# Patient Record
Sex: Male | Born: 1960 | Race: White | Hispanic: No | Marital: Married | Smoking: Former smoker
Health system: Southern US, Community
[De-identification: ages and names within clinical notes are randomized; demographics above are authoritative.]

## PROBLEM LIST (undated history)

## (undated) DIAGNOSIS — Z8719 Personal history of other diseases of the digestive system: Secondary | ICD-10-CM

## (undated) DIAGNOSIS — M7541 Impingement syndrome of right shoulder: Secondary | ICD-10-CM

## (undated) DIAGNOSIS — Z972 Presence of dental prosthetic device (complete) (partial): Secondary | ICD-10-CM

## (undated) DIAGNOSIS — F419 Anxiety disorder, unspecified: Secondary | ICD-10-CM

## (undated) DIAGNOSIS — G629 Polyneuropathy, unspecified: Secondary | ICD-10-CM

## (undated) DIAGNOSIS — M24111 Other articular cartilage disorders, right shoulder: Secondary | ICD-10-CM

## (undated) DIAGNOSIS — I10 Essential (primary) hypertension: Secondary | ICD-10-CM

## (undated) DIAGNOSIS — K219 Gastro-esophageal reflux disease without esophagitis: Secondary | ICD-10-CM

## (undated) DIAGNOSIS — Z87442 Personal history of urinary calculi: Secondary | ICD-10-CM

## (undated) DIAGNOSIS — M19011 Primary osteoarthritis, right shoulder: Secondary | ICD-10-CM

## (undated) DIAGNOSIS — M720 Palmar fascial fibromatosis [Dupuytren]: Secondary | ICD-10-CM

## (undated) DIAGNOSIS — G5621 Lesion of ulnar nerve, right upper limb: Secondary | ICD-10-CM

## (undated) DIAGNOSIS — K648 Other hemorrhoids: Secondary | ICD-10-CM

## (undated) DIAGNOSIS — E119 Type 2 diabetes mellitus without complications: Secondary | ICD-10-CM

## (undated) DIAGNOSIS — S46019A Strain of muscle(s) and tendon(s) of the rotator cuff of unspecified shoulder, initial encounter: Secondary | ICD-10-CM

## (undated) DIAGNOSIS — Z85828 Personal history of other malignant neoplasm of skin: Secondary | ICD-10-CM

## (undated) DIAGNOSIS — M2560 Stiffness of unspecified joint, not elsewhere classified: Secondary | ICD-10-CM

## (undated) HISTORY — PX: COLONOSCOPY: SHX174

---

## 1996-07-18 HISTORY — PX: SHOULDER ARTHROSCOPY: SHX128

## 1997-07-18 HISTORY — PX: CERVICAL DISC SURGERY: SHX588

## 1997-11-27 ENCOUNTER — Ambulatory Visit (HOSPITAL_COMMUNITY): Admission: RE | Admit: 1997-11-27 | Discharge: 1997-11-27 | Payer: Self-pay | Admitting: Neurosurgery

## 1998-05-06 ENCOUNTER — Ambulatory Visit (HOSPITAL_COMMUNITY): Admission: RE | Admit: 1998-05-06 | Discharge: 1998-05-06 | Payer: Self-pay | Admitting: Neurosurgery

## 1998-05-06 ENCOUNTER — Encounter: Payer: Self-pay | Admitting: Neurosurgery

## 1998-05-20 ENCOUNTER — Encounter: Payer: Self-pay | Admitting: Neurosurgery

## 1998-05-20 ENCOUNTER — Ambulatory Visit (HOSPITAL_COMMUNITY): Admission: RE | Admit: 1998-05-20 | Discharge: 1998-05-20 | Payer: Self-pay | Admitting: Neurosurgery

## 1998-06-03 ENCOUNTER — Encounter: Payer: Self-pay | Admitting: Neurosurgery

## 1998-06-03 ENCOUNTER — Ambulatory Visit (HOSPITAL_COMMUNITY): Admission: RE | Admit: 1998-06-03 | Discharge: 1998-06-03 | Payer: Self-pay | Admitting: Neurosurgery

## 1998-06-18 ENCOUNTER — Encounter: Payer: Self-pay | Admitting: Neurosurgery

## 1998-06-19 ENCOUNTER — Ambulatory Visit (HOSPITAL_COMMUNITY): Admission: RE | Admit: 1998-06-19 | Discharge: 1998-06-19 | Payer: Self-pay | Admitting: Neurosurgery

## 1998-06-22 ENCOUNTER — Encounter: Payer: Self-pay | Admitting: Neurosurgery

## 1998-06-23 ENCOUNTER — Encounter: Payer: Self-pay | Admitting: Neurosurgery

## 1998-06-23 ENCOUNTER — Inpatient Hospital Stay (HOSPITAL_COMMUNITY): Admission: RE | Admit: 1998-06-23 | Discharge: 1998-06-24 | Payer: Self-pay | Admitting: Neurosurgery

## 2001-06-07 ENCOUNTER — Ambulatory Visit (HOSPITAL_COMMUNITY): Admission: RE | Admit: 2001-06-07 | Discharge: 2001-06-07 | Payer: Self-pay | Admitting: General Surgery

## 2002-11-06 ENCOUNTER — Inpatient Hospital Stay (HOSPITAL_COMMUNITY): Admission: RE | Admit: 2002-11-06 | Discharge: 2002-11-08 | Payer: Self-pay | Admitting: Neurosurgery

## 2002-11-06 ENCOUNTER — Encounter: Payer: Self-pay | Admitting: Neurosurgery

## 2002-11-06 HISTORY — PX: NECK HARDWARE REMOVAL: SUR1129

## 2002-11-06 HISTORY — PX: CERVICAL FUSION: SHX112

## 2008-01-29 ENCOUNTER — Inpatient Hospital Stay (HOSPITAL_COMMUNITY): Admission: RE | Admit: 2008-01-29 | Discharge: 2008-01-31 | Payer: Self-pay | Admitting: Neurosurgery

## 2008-01-29 HISTORY — PX: NECK HARDWARE REMOVAL: SUR1129

## 2008-07-18 HISTORY — PX: WRIST SURGERY: SHX841

## 2008-07-18 HISTORY — PX: CARPAL TUNNEL RELEASE: SHX101

## 2010-11-30 NOTE — Op Note (Signed)
NAMEALBIE, ARIZPE               ACCOUNT NO.:  [id number]   MEDICAL RECORD NO.:  [id number]          PATIENT TYPE:  OIB   LOCATION:  3009                         FACILITY:  [facility]   PHYSICIAN:  Hilda Lias, M.D.   DATE OF BIRTH:  1961/05/25   DATE OF PROCEDURE:  01/29/2008  DATE OF DISCHARGE:                               OPERATIVE REPORT   PREOPERATIVE DIAGNOSES:  1. Status post C4-C5 fusion with loose screws inferior right side.  2. Odynophagia.   POSTOPERATIVE DIAGNOSES:  1. Status post C4-C5 fusion with loose screws inferior right side.  2. Odynophagia.   PROCEDURE:  1. Removal of the plate and the four screws.  2. C-arm.   SURGEON:  Hilda Lias, MD   ASSISTANT:  Payton Doughty, MD from Neurosurgery and [facility]. Lazarus Salines, MD,  from ENT.   CLINICAL HISTORY:  Mr. Yuan is a gentleman who 5 years ago underwent  fusion.  I have not seen him in a long time and the last week he came  complaining of a hiccup and immediately developed difficult to swallow.  X-rays showed that there was loose screws inferior right side.  I  compared with the previous last x-ray 4 years ago and at that time he  was absolutely normal.  Because of that, surgery was advised.  He and  his wife knew about the risk with the surgery including the possibility  of perforation of esophagus.   PROCEDURE:  The patient was taken to the OR and after intubation, the  left side of the neck was cleaned with DuraPrep.  Transverse incision  was made from the previous one and carried through subcutaneous space,  platysma down to the cervical area.  We found quite a bit of adhesion  and lysis was accomplished.  Then after 10 minutes of lysis, we were  able to see the plate.  The plate plus the three screws were removed  without any problem whatsoever.  The screws which was loose already the  area was filled up with fibrotic tissue.  Then the difficult part of the  procedure was finding the lost screws.  We knew  it was in the right side  and with extension/flexion it moved so we knew it was a soft tissue.  We  started our dissection and we found quite a bit of adhesions.  Several C-  arm x-ray using AP and lateral view was done and although we were able  to pin point the area where the screw was but the time we changed  position pulling the retractor, the screw moved.  At the end, we were  able to localize using [employer] retractor and localized the screw right  at the edges between the spine and esophagus.  Removal was done easily.  Nevertheless, I called Dr. Lazarus Salines from the ENT Service.  He inspected  the area and there was no evidence of any bone in the esophagus.  Nevertheless, we went ahead and introduced an NG tube.  We brought the  NG tube  right below the area where the screw was with squeezing that  side  methylene blue through the NG tube.  There was no evidence of any  leakage whatsoever through the esophagus.  From then on, the area was  irrigated.  Hemostasis was accomplished and the wound was closed with  Vicryl and Steri-Strips.           ______________________________  Hilda Lias, M.D.     EB/MEDQ  D:  01/29/2008  T:  01/30/2008  Job:  [id number]

## 2010-12-03 NOTE — H&P (Signed)
NAMEMAXAMILLIAN, Jon Wong                         ACCOUNT NO.:  [id number]   MEDICAL RECORD NO.:  [id number]                   PATIENT TYPE:  INP   LOCATION:  3172                                 FACILITY:  [facility]   PHYSICIAN:  Jon Wong, M.D.                DATE OF BIRTH:  Nov 27, 1960   DATE OF ADMISSION:  11/06/2002  DATE OF DISCHARGE:                                HISTORY & PHYSICAL   CLINICAL HISTORY:  Mr. Jon Wong is a gentleman who back in 1999 underwent  anterior C5-C6 diskectomy for cervical herniated disc.  The patient did  well.  I have not seen him lately, but he came to my office with his wife  telling me that since January of this year, he has had some electricity  sensation all over his body every time he extends his neck followed with  bilateral weakness.  I saw him in the office, and after my clinical finding,  I sent him to have immediately an emergency MRI which showed that he has a  huge herniated disc at the L4-L5 with compromise of the spinal cord.  The  patient was sent home to be out of work, and he was advised to think about  surgery.  He called yesterday ready for surgery.  The patient denies any  problem with bladder or bowel.   PAST MEDICAL HISTORY:  Surgery on the shoulder and previous a discectomy by  me.   ALLERGIES:  Vicodin.   SOCIAL HISTORY:  The patient smokes one-half pack a day.  He does not drink.   FAMILY HISTORY:  Unremarkable.   REVIEW OF SYSTEMS:  Positive for frequent ear and nasal congestion, high  cholesterol, kidney stones, arthritis.  He has a history of depression.  He  has a skin cancer diagnosed back is 2002.   PHYSICAL EXAMINATION:  GENERAL:  The patient came to my office with his  wife.  VITAL SIGNS:  Normal.  NECK:  He has a previous scar.  Flexion and extension is quite limited  secondary to pain.  When he went more than 5 degrees in hyperextension, he  was complaining of some electricity along the spine.  LUNGS:   Clear.  HEART:  Sounds normal.  ABDOMEN:  Normal.  EXTREMITIES:  Normal pulses.  No metatarsal __________.  NEURO:  Cranial nerves normal.  Strength:  I can grade easily both deltoids.  He has normal biceps and normal triceps.  In the lower extremity, he has  weakness of both legs, especially with repetitive movement.  Reflexes 1+ at  the level of the biceps, 3+ in the lower extremity.  Clonus bilaterally, and  bilateral Babinski.  Coordination:  The patient cannot walk in a straight  line.   STUDIES:  The MRI showed that he has a severe case of herniated disc at the  L4-L5 with compromise of the spinal cord after reporting that we  can see  some changes of gliosis within the spinal cord itself.  Mild central disc of  the low C6-7.   RECOMMENDATIONS:  The patient is being admitted for anterior cervical  discectomy at the level of L4-L5.  The procedure will be to remove the  previous plate, decompress the spinal cord during C4-C5 discectomy, followed  by  plate and a graft.  He knows all the risks which include but are not limited  to the benefits of the surgery, worsening of the weakness, infection or  damage to the vocal cords, damage to the esophagus because of second surgery  in the same level.  He declined another opinion.                                               Jon Wong, M.D.    EB/MEDQ  D:  11/06/2002  T:  11/06/2002  Job:  [id number]

## 2010-12-03 NOTE — Op Note (Signed)
NAMETRUST, LEH                         ACCOUNT NO.:  [id number]   MEDICAL RECORD NO.:  [id number]                   PATIENT TYPE:  INP   LOCATION:  3172                                 FACILITY:  [facility]   PHYSICIAN:  Hilda Lias, M.D.                DATE OF BIRTH:  1960-11-18   DATE OF PROCEDURE:  11/06/2002  DATE OF DISCHARGE:                                 OPERATIVE REPORT   PREOPERATIVE DIAGNOSIS:  Cervical myelopathy secondary to herniated disk at  the level 4-5 with displacement of spinal cord.  Status post C5-6 diskectomy  and fusion.   POSTOPERATIVE DIAGNOSIS:  Cervical myelopathy secondary to herniated disk at  the level 4-5 with displacement of spinal cord.  Status post C5-6 diskectomy  and fusion.   OPERATION PERFORMED:  Removal of C5 to 6 plate.  Total gross diskectomy at  C4-C5.  Decompression of the spinal cord.  Bilateral foraminotomy.  Interbody fusion with allograft, iliac crest, insertion of new plate from C4  to C5.  Microscope.   SURGEON:  Hilda Lias, M.D.   ASSISTANT:  Hewitt Shorts, M.D.   ANESTHESIA:  General.   INDICATIONS FOR PROCEDURE:  The patient is a gentleman who was seen in my  office two days ago because of neck pain associated with weakness,  hyperreflexia, Babinski bilaterally and unable to walk in a straight line.  MRI which was done as emergency, showed that he has a huge herniated disk at  the level of C5-C6 with displacement of the spinal cord and changes in the  spinal cord itself.  Surgery was advised.  The risks were explained in the  history and physical.   DESCRIPTION OF PROCEDURE:  The patient was taken to the operating room and  after intubation which done in neutral position, the neck was prepped with  Betadine.  Transverse incision was followed down the fold of the skin was  made through the skin, platysma, down to the cervical spine.  By x-ray they  found the upper part of the previous plate.  We identified  the C4-C5 disk.  The anterior ligament was opened and then we brought the microscope into the  area.  Using the microcuret, total diskectomy was accomplished.  Indeed we  found some opening in posterior ligament.  Incision was made in the  posterior ligament and there were some fragments going in the midline, going  laterally compromising the spinal cord as well as the C5 nerve root.  Total  gross diskectomy was achieved.  Then the end plate was drilled and a piece  of iliac crest bone graft was introduced between 4-5.  Then we did our lysis  of adhesions and we were able to remove the old plate as well as the four  screws.  From then on we made a space for the new plate and the new plate  was between 4 and  5 using four screws.  Lateral C-spine showed good position  of the bone graft.  At the end the area was investigated.  The esophagus,  carotid and trachea were normal.  Hemostasis was done with bipolar cautery.  The wound was closed with Vicryl and Steri-Strips.                                                Hilda Lias, M.D.   EB/MEDQ  D:  11/06/2002  T:  11/06/2002  Job:  [id number]

## 2011-04-14 LAB — CBC
HCT: 42.2
Hemoglobin: 14.6
MCHC: 34.7
MCV: 90.4
RBC: 4.67
WBC: 7.8

## 2011-04-14 LAB — BASIC METABOLIC PANEL
CO2: 29
Chloride: 105
GFR calc Af Amer: >60
Potassium: 5
Sodium: 140

## 2012-09-17 ENCOUNTER — Emergency Department (HOSPITAL_COMMUNITY)
Admission: EM | Admit: 2012-09-17 | Discharge: 2012-09-18 | Disposition: A | Discharge status: Home / Self Care | Payer: PRIVATE HEALTH INSURANCE | Attending: Emergency Medicine | Admitting: Emergency Medicine

## 2012-09-17 ENCOUNTER — Encounter (HOSPITAL_COMMUNITY): Payer: Self-pay

## 2012-09-17 DIAGNOSIS — Z7982 Long term (current) use of aspirin: Secondary | ICD-10-CM | POA: Insufficient documentation

## 2012-09-17 DIAGNOSIS — IMO0002 Reserved for concepts with insufficient information to code with codable children: Secondary | ICD-10-CM | POA: Insufficient documentation

## 2012-09-17 DIAGNOSIS — Z8679 Personal history of other diseases of the circulatory system: Secondary | ICD-10-CM | POA: Insufficient documentation

## 2012-09-17 DIAGNOSIS — E119 Type 2 diabetes mellitus without complications: Secondary | ICD-10-CM | POA: Insufficient documentation

## 2012-09-17 DIAGNOSIS — F411 Generalized anxiety disorder: Secondary | ICD-10-CM | POA: Insufficient documentation

## 2012-09-17 DIAGNOSIS — I10 Essential (primary) hypertension: Secondary | ICD-10-CM

## 2012-09-17 DIAGNOSIS — Z79899 Other long term (current) drug therapy: Secondary | ICD-10-CM | POA: Insufficient documentation

## 2012-09-17 DIAGNOSIS — Z87442 Personal history of urinary calculi: Secondary | ICD-10-CM | POA: Insufficient documentation

## 2012-09-17 HISTORY — DX: Essential (primary) hypertension: I10

## 2012-09-17 HISTORY — DX: Anxiety disorder, unspecified: F41.9

## 2012-09-17 LAB — CBC WITH DIFFERENTIAL/PLATELET
Eosinophils Relative: 1 % (ref 0–5)
HCT: 44.9 % (ref 39.0–52.0)
Hemoglobin: 16.2 g/dL (ref 13.0–17.0)
Lymphocytes Relative: 20 % (ref 12–46)
Lymphs Abs: 2.8 10*3/uL (ref 0.7–4.0)
MCV: 87.7 fL (ref 78.0–100.0)
Monocytes Relative: 10 % (ref 3–12)
Platelets: 235 10*3/uL (ref 150–400)
RBC: 5.12 MIL/uL (ref 4.22–5.81)
WBC: 14.2 10*3/uL — ABNORMAL HIGH (ref 4.0–10.5)

## 2012-09-17 LAB — POCT I-STAT, CHEM 8
BUN: 18 mg/dL (ref 6–23)
Calcium, Ion: 1.09 mmol/L — ABNORMAL LOW (ref 1.12–1.23)
Chloride: 104 mEq/L (ref 96–112)
Creatinine, Ser: 0.9 mg/dL (ref 0.50–1.35)

## 2012-09-17 LAB — TROPONIN I: Troponin I: <0.3 ng/mL (ref <0.30)

## 2012-09-17 LAB — GLUCOSE, CAPILLARY: Glucose-Capillary: 123 mg/dL — ABNORMAL HIGH (ref 70–99)

## 2012-09-17 NOTE — ED Notes (Signed)
Pt's CBG=123 

## 2012-09-17 NOTE — ED Notes (Signed)
Per EMS, pt. C/o "not feeling right". C/o HA and anxiety. Upon EMS arrival pt slow to respond, no neuro deficits. Pt. Oriented x4.

## 2012-09-17 NOTE — ED Notes (Signed)
Pt. States "I took amoxicillin for a sore throat and I had an allergic reaction" x2 ago. States allergic reaction is his head "feeling funny. I'm okay in the morning but at night I feel like I'm in a daze". Pt. Reports HA and tingling in hands. Pt. Alert and oriented. States takes BP medication in the morning.

## 2012-09-17 NOTE — ED Notes (Signed)
Per pt family, pt started "feeling funny" over the weekend. States pt has been "sluggish" since he had teh allergic reaction to amoxicillin and started the prednisone.

## 2012-09-17 NOTE — ED Provider Notes (Signed)
History     CSN: [id number]  Arrival date & time 09/17/12  2038   First MD Initiated Contact with Patient 09/17/12 2149      Chief Complaint  Patient presents with  . Hypertension    (Consider location/radiation/quality/duration/timing/severity/associated sxs/prior treatment) HPI  Jon Wong is a 52 year old male with a past medical history significant for major depression, anxiety, diabetes and hypertension who presents to emergency department for evaluation of high blood pressure.  The patient states that he was at work today and feeling "lightheaded".  He had his blood pressure taken by the on-site nurse.  Patient states that his blood pressure work was approximately 209/150.  Patient was sent to the emergency department immediately.  The patient has a recent history of pain with swallowing patient states he was seen by his primary care [name] who noticed some exudate on his posterior.  He was prescribed amoxicillin.  The patient gave him amoxicillin.  At one daily patient developed severe systemic urticaria.  The patient was then started on prednisone.  Since that time he has been feeling "woozy" he headed."  He generally feels this way at the same time every day in the evening just before his next dose of prednisone.  Denies unilateral weakness, facial asymmetry, difficulty with speech, change in gait, or vertigo.  Patient states he has had increased anxiety due to his high blood pressure.  He did not take any of his tonight.  The patient's family is in the room and date the patient appears to be at baseline.   Past Medical History  Diagnosis Date  . Anxiety   . Hypertension   . Ruptured disk   . Diabetes mellitus without complication   . Afib   . Kidney stones     Past Surgical History  Procedure Laterality Date  . Back surgery      No family history on file.  History  Substance Use Topics  . Smoking status: Not on file  . Smokeless tobacco: Not on file  . Alcohol  Use: Not on file      Review of Systems Ten systems reviewed and are negative for acute change, except as noted in the HPI.   Allergies  Amoxicillin and Hydrocodone  Home Medications   Current Outpatient Rx  Name  Route  Sig  Dispense  Refill  . ALPRAZolam (XANAX) 0.5 MG tablet   Oral   Take 1-1.5 mg by mouth at bedtime. 2-3 tablets depending on how tired pt feels         . aspirin EC 81 MG tablet   Oral   Take 81 mg by mouth every evening.         . celecoxib (CELEBREX) 200 MG capsule   Oral   Take 400 mg by mouth every evening.         . Cholecalciferol (VITAMIN D-3) 5000 UNITS TABS   Oral   Take 5,000 Units by mouth daily.         . citalopram (CELEXA) 40 MG tablet   Oral   Take 40 mg by mouth every evening.         . cyanocobalamin 2000 MCG tablet   Oral   Take 2,000 mcg by mouth daily.         Marland Kitchen EPINEPHrine (EPIPEN IJ)   Injection   Inject 1 application as directed as needed. For allergic reactions         . Eszopiclone 3 MG TABS   Oral  Take 3 mg by mouth at bedtime. Take immediately before bedtime         . fenofibrate (TRICOR) 145 MG tablet   Oral   Take 145 mg by mouth every evening.         Marland Kitchen METFORMIN HCL PO   Oral   Take 1 tablet by mouth every evening.         . predniSONE (DELTASONE) 20 MG tablet   Oral   Take 20-120 mg by mouth See admin instructions. Tapered dose started 09/07/2012:  6 tabs for 2 days, 5 tabs for 2 days, 4 tabs for 2 days, 3 tabs for 2 days, 2 tabs for 2 days, 1 tab for 2 days - takes at night         . ramipril (ALTACE) 5 MG capsule   Oral   Take 5 mg by mouth daily.         Marland Kitchen SIMVASTATIN PO   Oral   Take 1 tablet by mouth every evening.           BP 156/94  Pulse 85  Temp(Src) 98.2 F (36.8 C) (Oral)  Resp 21  SpO2 96%  Physical Exam Physical Exam  Nursing note and vitals reviewed. Constitutional: He appears well-developed and well-nourished. No distress.  HENT:  Head:  Normocephalic and atraumatic.  Eyes: Conjunctivae normal are normal. No scleral icterus.  Neck: Normal range of motion. Neck supple.  Cardiovascular: Normal rate, regular rhythm and normal heart sounds.   Pulmonary/Chest: Effort normal and breath sounds normal. No respiratory distress.  Abdominal: Soft. There is no tenderness.  Musculoskeletal: He exhibits no edema.  Neurological: He is alert.  Speech is clear and goal oriented, follows commands Major Cranial nerves without deficit, no facial droop Normal strength in upper and lower extremities bilaterally including dorsiflexion and plantar flexion, strong and equal grip strength Sensation normal to light and sharp touch Moves extremities without ataxia, coordination intact Normal finger to nose and rapid alternating movements Neg romberg, no pronator drift Normal heel-shin and balance Skin: Skin is warm and dry. He is not diaphoretic.  Psychiatric: His behavior is normal.    ED Course  Procedures (including critical care time)  Labs Reviewed  GLUCOSE, CAPILLARY - Abnormal; Notable for the following:    Glucose-Capillary 123 (*)    All other components within normal limits  CBC WITH DIFFERENTIAL - Abnormal; Notable for the following:    WBC 14.2 (*)    MCHC 36.1 (*)    Neutro Abs 10.0 (*)    Monocytes Absolute 1.4 (*)    All other components within normal limits  GLUCOSE, CAPILLARY - Abnormal; Notable for the following:    Glucose-Capillary 121 (*)    All other components within normal limits  POCT I-STAT, CHEM 8 - Abnormal; Notable for the following:    Glucose, Bld 127 (*)    Calcium, Ion 1.09 (*)    All other components within normal limits  TROPONIN I   No results found.   Date: 09/18/2012  Rate:83  Rhythm: normal sinus rhythm  QRS Axis: normal  Intervals: normal  ST/T Wave abnormalities: Nonspecific T wave abnormalities  Conduction Disutrbances: none  Narrative Interpretation:   Old EKG Reviewed: No  significant changes noted     No diagnosis found.    MDM   Filed Vitals:   09/17/12 2145 09/17/12 2215 09/17/12 2245 09/17/12 2345  BP: 163/100 171/100 155/100 156/99  Pulse: 83 88 83 88  Temp:  TempSrc:      Resp: 22 21 13 26   SpO2: 95% 96% 96% 96%  patient with complaint of feeling strange and high blood pressure. .Denies stroke like symptoms.   12:33 AM CBG (last 3)   Recent Labs  09/17/12 2058 09/18/12 0023  GLUCAP 123* 121*  patient was appeared  bit diaphoretic.  He states that he is feeling much better but thought maybe his blood sugar was dropping as he has not had food since 6;30 PM/  The patient blood sugar is normal.  He has fairly unremarkable EKG and negative troponin negative.   Blood sugar is  Mildly elevated and the patient has a leukocytosis.  This is likely due to corticosteroid side effcts.   Patient noted to be hypertensive in the emergency department.  No signs of hypertensive urgency.  D/C use of the corticosteroid. Discussed with patient the need for close follow-up and management by their primary care physician.       Arthor Captain, PA-C 09/18/12 [phone]

## 2012-09-18 LAB — GLUCOSE, CAPILLARY: Glucose-Capillary: 121 mg/dL — ABNORMAL HIGH (ref 70–99)

## 2012-09-18 NOTE — ED Notes (Signed)
PA at bedside.

## 2012-09-18 NOTE — ED Provider Notes (Signed)
Jon Wong is a 52 y.o. male who felt "foggy" at work. His blood pressure was checked by a security guard and was high. He feels better now. He denies chest pain, weakness, or dizziness.  Exam alert, cooperative. Repeat vital signs, serially, indicate mild hypertension. Neurologic exam: Alert and oriented x3. Behavior is appropriate. Strength and sensation is normal in the arms, and legs bilaterally. Romberg is negative.    Assessment. Mild, hypertension. Doubt CVA, metabolic instability, or an meningiitis  Plan: See PCP one week for blood pressure check.   Medical screening examination/treatment/procedure(s) were conducted as a shared visit with non-physician practitioner(s) and myself.  I personally evaluated the patient during the encounter  Flint Melter, MD 09/18/12 [phone]

## 2012-09-18 NOTE — ED Notes (Signed)
Dr. Wentz at bedside. 

## 2012-09-18 NOTE — ED Provider Notes (Signed)
Medical screening examination/treatment/procedure(s) were performed by non-physician practitioner and as supervising physician I was immediately available for consultation/collaboration.   Flint Melter, MD 09/18/12 [phone]

## 2012-09-18 NOTE — ED Notes (Signed)
Pt. Alert and oriented x4. States "I feel a little better than I did when I came in". Pt. Verbalized understanding of medications.

## 2014-11-24 ENCOUNTER — Emergency Department (HOSPITAL_COMMUNITY): Payer: BLUE CROSS/BLUE SHIELD

## 2014-11-24 ENCOUNTER — Encounter (HOSPITAL_COMMUNITY): Payer: Self-pay | Admitting: Cardiology

## 2014-11-24 ENCOUNTER — Emergency Department (HOSPITAL_COMMUNITY)
Admission: EM | Admit: 2014-11-24 | Discharge: 2014-11-24 | Disposition: A | Discharge status: Home / Self Care | Payer: BLUE CROSS/BLUE SHIELD | Attending: Emergency Medicine | Admitting: Emergency Medicine

## 2014-11-24 DIAGNOSIS — I10 Essential (primary) hypertension: Secondary | ICD-10-CM | POA: Insufficient documentation

## 2014-11-24 DIAGNOSIS — Z880 Allergy status to penicillin: Secondary | ICD-10-CM | POA: Diagnosis not present

## 2014-11-24 DIAGNOSIS — Z7982 Long term (current) use of aspirin: Secondary | ICD-10-CM | POA: Insufficient documentation

## 2014-11-24 DIAGNOSIS — E119 Type 2 diabetes mellitus without complications: Secondary | ICD-10-CM | POA: Diagnosis not present

## 2014-11-24 DIAGNOSIS — Z79899 Other long term (current) drug therapy: Secondary | ICD-10-CM | POA: Insufficient documentation

## 2014-11-24 DIAGNOSIS — J40 Bronchitis, not specified as acute or chronic: Secondary | ICD-10-CM | POA: Diagnosis not present

## 2014-11-24 DIAGNOSIS — R4182 Altered mental status, unspecified: Secondary | ICD-10-CM | POA: Diagnosis not present

## 2014-11-24 DIAGNOSIS — Z791 Long term (current) use of non-steroidal anti-inflammatories (NSAID): Secondary | ICD-10-CM | POA: Insufficient documentation

## 2014-11-24 DIAGNOSIS — R251 Tremor, unspecified: Secondary | ICD-10-CM | POA: Insufficient documentation

## 2014-11-24 DIAGNOSIS — Z87442 Personal history of urinary calculi: Secondary | ICD-10-CM | POA: Insufficient documentation

## 2014-11-24 DIAGNOSIS — Z8739 Personal history of other diseases of the musculoskeletal system and connective tissue: Secondary | ICD-10-CM | POA: Insufficient documentation

## 2014-11-24 DIAGNOSIS — I4891 Unspecified atrial fibrillation: Secondary | ICD-10-CM | POA: Diagnosis not present

## 2014-11-24 LAB — CBC WITH DIFFERENTIAL/PLATELET
BASOS PCT: 1 % (ref 0–1)
Basophils Absolute: 0.1 10*3/uL (ref 0.0–0.1)
EOS ABS: 0.1 10*3/uL (ref 0.0–0.7)
Eosinophils Relative: 1 % (ref 0–5)
HEMATOCRIT: 45.7 % (ref 39.0–52.0)
HEMOGLOBIN: 15.5 g/dL (ref 13.0–17.0)
LYMPHS ABS: 1.8 10*3/uL (ref 0.7–4.0)
LYMPHS PCT: 15 % (ref 12–46)
MCH: 30.5 pg (ref 26.0–34.0)
MCHC: 33.9 g/dL (ref 30.0–36.0)
MCV: 90 fL (ref 78.0–100.0)
MONO ABS: 1.2 10*3/uL — AB (ref 0.1–1.0)
MONOS PCT: 10 % (ref 3–12)
NEUTROS ABS: 9.2 10*3/uL — AB (ref 1.7–7.7)
NEUTROS PCT: 75 % (ref 43–77)
Platelets: 313 10*3/uL (ref 150–400)
RBC: 5.08 MIL/uL (ref 4.22–5.81)
RDW: 12.1 % (ref 11.5–15.5)
WBC: 12.3 10*3/uL — AB (ref 4.0–10.5)

## 2014-11-24 LAB — COMPREHENSIVE METABOLIC PANEL
ALBUMIN: 4.3 g/dL (ref 3.5–5.0)
ALK PHOS: 59 U/L (ref 38–126)
ALT: 20 U/L (ref 17–63)
ANION GAP: 6 (ref 5–15)
AST: 17 U/L (ref 15–41)
BILIRUBIN TOTAL: 0.5 mg/dL (ref 0.3–1.2)
BUN: 22 mg/dL — AB (ref 6–20)
CO2: 29 mmol/L (ref 22–32)
CREATININE: 1.08 mg/dL (ref 0.61–1.24)
Calcium: 9.4 mg/dL (ref 8.9–10.3)
Chloride: 102 mmol/L (ref 101–111)
GFR calc non Af Amer: >60 mL/min (ref >60)
GLUCOSE: 135 mg/dL — AB (ref 70–99)
POTASSIUM: 4.3 mmol/L (ref 3.5–5.1)
Sodium: 137 mmol/L (ref 135–145)
TOTAL PROTEIN: 6.7 g/dL (ref 6.5–8.1)

## 2014-11-24 LAB — CBG MONITORING, ED: Glucose-Capillary: 165 mg/dL — ABNORMAL HIGH (ref 70–99)

## 2014-11-24 MED ORDER — SODIUM CHLORIDE 0.9 % IV BOLUS (SEPSIS)
1000.0000 mL | Freq: Once | INTRAVENOUS | Status: AC
  Administered 2014-11-24: 1000 mL via INTRAVENOUS

## 2014-11-24 NOTE — Discharge Instructions (Signed)
Stop taking the cough medicine and follow up with your md this week for recheck.  Rest for 1-2 days at home

## 2014-11-24 NOTE — ED Notes (Signed)
Yesterday at church had an onset of "foggy feeling in my head".  States this morning he has had some shaking in his left arm.  No shaking at present time.  States he still feels foggy.

## 2014-11-24 NOTE — ED Provider Notes (Signed)
CSN: [id number]     Arrival date & time 11/24/14  [id number] History  This chart was scribed for Bethann BerkshireJoseph Orean Giarratano, MD by [place] Peace, ED Scribe. The patient was seen in [facility]. The patient's care was started at 10:04 AM.    Chief Complaint  Patient presents with  . Altered Mental Status  . Shaking      Patient is a 54 y.o. male presenting with altered mental status. The history is provided by the patient and the spouse (pt has had some dizziness and confusion). No language interpreter was used.  Altered Mental Status Presenting symptoms: confusion   Severity:  Moderate Most recent episode:  Yesterday Episode history:  Multiple Chronicity:  New Context: recent change in medication   Associated symptoms: no abdominal pain, no headaches, no rash and no seizures   HPI Comments: Jon Wong is a 54 y.o. male who presents to the Emergency Department complaining of altered mental status onset yesterday where he began experiencing a sudden "foggy feeling in this head" while he was at church. He further reports that he had also been experiencing intermittent episodes of dizziness. He goes on to explain that he thought he was feeling better this morning and decided to go on to work. When he got to work, he reports that his left hand started "twitching" uncontrollably while he was sitting at his desk which caused him to come into ED today to be seen. Pt's wife reports he was seen by his doctor last week for a bronchial infection and prescribed Clindamycin and some pain medication. History of hypertension and DM.    Past Medical History  Diagnosis Date  . Anxiety   . Hypertension   . Ruptured disk   . Diabetes mellitus without complication   . Afib   . Kidney stones    Past Surgical History  Procedure Laterality Date  . Back surgery     No family history on file. History  Substance Use Topics  . Smoking status: Never Smoker   . Smokeless tobacco: Not on file  . Alcohol Use: Yes      Comment: occasional     Review of Systems  Constitutional: Negative for appetite change and fatigue.  HENT: Negative for congestion, ear discharge and sinus pressure.   Eyes: Negative for discharge.  Respiratory: Negative for cough.   Cardiovascular: Negative for chest pain.  Gastrointestinal: Negative for abdominal pain and diarrhea.  Genitourinary: Negative for frequency and hematuria.  Musculoskeletal: Negative for back pain.  Skin: Negative for rash.  Neurological: Positive for dizziness and tremors. Negative for seizures and headaches.  Psychiatric/Behavioral: Positive for confusion.      Allergies  Amoxicillin and Hydrocodone  Home Medications   Prior to Admission medications   Medication Sig Start Date End Date Taking? Authorizing Provider  aspirin EC 81 MG tablet Take 81 mg by mouth every evening.    Historical Provider, MD  celecoxib (CELEBREX) 200 MG capsule Take 400 mg by mouth every evening.    Historical Provider, MD  Cholecalciferol (VITAMIN D-3) 5000 UNITS TABS Take 5,000 Units by mouth daily.    Historical Provider, MD  citalopram (CELEXA) 40 MG tablet Take 40 mg by mouth every evening.    Historical Provider, MD  cyanocobalamin 2000 MCG tablet Take 2,000 mcg by mouth daily.    Historical Provider, MD  EPINEPHrine (EPIPEN IJ) Inject 1 application as directed as needed. For allergic reactions    Historical Provider, MD  fenofibrate (TRICOR) 145 MG tablet  Take 145 mg by mouth every evening.    Historical Provider, MD  ramipril (ALTACE) 5 MG capsule Take 5 mg by mouth daily.    Historical Provider, MD   BP 126/87 mmHg  Pulse 79  Temp(Src) 97.8 F (36.6 C) (Oral)  Resp 18  Ht 5\' 9"  (1.753 m)  Wt 194 lb (87.998 kg)  BMI 28.64 kg/m2  SpO2 97% Physical Exam  Constitutional: He is oriented to person, place, and time. He appears well-developed.  HENT:  Head: Normocephalic.  Eyes: Conjunctivae and EOM are normal. No scleral icterus.  Pupils are pinpoint.    Neck: Neck supple. No thyromegaly present.  Cardiovascular: Normal rate and regular rhythm.  Exam reveals no gallop and no friction rub.   No murmur heard. Pulmonary/Chest: No stridor. He has no wheezes. He has no rales. He exhibits no tenderness.  Abdominal: He exhibits no distension. There is no tenderness. There is no rebound.  Musculoskeletal: Normal range of motion. He exhibits no edema.  Lymphadenopathy:    He has no cervical adenopathy.  Neurological: He is oriented to person, place, and time. He exhibits normal muscle tone. Coordination normal.  Skin: No rash noted. No erythema.    ED Course  Procedures (including critical care time) Labs Review Labs Reviewed  CBG MONITORING, ED - Abnormal; Notable for the following:    Glucose-Capillary 165 (*)    All other components within normal limits  CBC WITH DIFFERENTIAL/PLATELET  COMPREHENSIVE METABOLIC PANEL  CBG MONITORING, ED    Imaging Review No results found.   EKG Interpretation None     Medications - No data to display  10:08 AM- Treatment plan was discussed with patient who verbalizes understanding and agrees.   MDM   Final diagnoses:  None   Altered mental status from cough med.  Stop cough med and follow up with pcp  The chart was scribed for me under my direct supervision.  I personally performed the history, physical, and medical decision making and all procedures in the evaluation of this patient.Bethann Berkshire.   Hadi Dubin, MD 11/24/14 1320

## 2016-04-13 ENCOUNTER — Other Ambulatory Visit: Payer: Self-pay | Admitting: Neurosurgery

## 2016-04-13 DIAGNOSIS — M542 Cervicalgia: Secondary | ICD-10-CM

## 2016-04-13 DIAGNOSIS — M5416 Radiculopathy, lumbar region: Secondary | ICD-10-CM

## 2016-04-20 ENCOUNTER — Ambulatory Visit (INDEPENDENT_AMBULATORY_CARE_PROVIDER_SITE_OTHER): Payer: BLUE CROSS/BLUE SHIELD

## 2016-04-20 DIAGNOSIS — Z23 Encounter for immunization: Secondary | ICD-10-CM | POA: Diagnosis not present

## 2016-04-26 ENCOUNTER — Ambulatory Visit
Admission: RE | Admit: 2016-04-26 | Discharge: 2016-04-26 | Disposition: A | Discharge status: Home / Self Care | Payer: BLUE CROSS/BLUE SHIELD | Source: Ambulatory Visit | Attending: Neurosurgery | Admitting: Neurosurgery

## 2016-04-26 DIAGNOSIS — M5416 Radiculopathy, lumbar region: Secondary | ICD-10-CM

## 2016-04-26 DIAGNOSIS — M542 Cervicalgia: Secondary | ICD-10-CM

## 2016-04-26 MED ORDER — DIAZEPAM 5 MG PO TABS
5.0000 mg | ORAL_TABLET | Freq: Once | ORAL | Status: AC
  Administered 2016-04-26: 5 mg via ORAL

## 2016-04-26 MED ORDER — IOPAMIDOL (ISOVUE-M 300) INJECTION 61%
10.0000 mL | Freq: Once | INTRAMUSCULAR | Status: AC | PRN
  Administered 2016-04-26: 10 mL via INTRATHECAL

## 2016-04-26 MED ORDER — MEPERIDINE HCL 100 MG/ML IJ SOLN
75.0000 mg | Freq: Once | INTRAMUSCULAR | Status: AC
  Administered 2016-04-26: 75 mg via INTRAMUSCULAR

## 2016-04-26 MED ORDER — ONDANSETRON HCL 4 MG/2ML IJ SOLN: 4.0000 mg | Freq: Four times a day (QID) | INTRAMUSCULAR | Status: DC | PRN

## 2016-04-26 MED ORDER — ONDANSETRON HCL 4 MG/2ML IJ SOLN
4.0000 mg | Freq: Once | INTRAMUSCULAR | Status: AC
  Administered 2016-04-26: 4 mg via INTRAMUSCULAR

## 2016-04-26 NOTE — Discharge Instructions (Signed)
Myelogram Discharge Instructions  1. Go home and rest quietly for the next 24 hours.  It is important to lie flat for the next 24 hours.  Get up only to go to the restroom.  You may lie in the bed or on a couch on your back, your stomach, your left side or your right side.  You may have one pillow under your head.  You may have pillows between your knees while you are on your side or under your knees while you are on your back.  2. DO NOT drive today.  Recline the seat as far back as it will go, while still wearing your seat belt, on the way home.  3. You may get up to go to the bathroom as needed.  You may sit up for 10 minutes to eat.  You may resume your normal diet and medications unless otherwise indicated.  Drink plenty of extra fluids today and tomorrow.  4. The incidence of a spinal headache with nausea and/or vomiting is about 5% (one in 20 patients).  If you develop a headache, lie flat and drink plenty of fluids until the headache goes away.  Caffeinated beverages may be helpful.  If you develop severe nausea and vomiting or a headache that does not go away with flat bed rest, call [phone].  5. You may resume normal activities after your 24 hours of bed rest is over; however, do not exert yourself strongly or do any heavy lifting tomorrow.  6. Call your physician for a follow-up appointment.   You may resume Celexa on Wednesday, April 27, 2016 after 9:30a.m.

## 2016-04-26 NOTE — Progress Notes (Signed)
Patient states he has been off Celexa for at least the past two days.  jkl

## 2016-04-29 ENCOUNTER — Telehealth: Payer: Self-pay

## 2016-04-29 NOTE — Telephone Encounter (Signed)
LMOM at home asking how patient is doing after myelogram here 04/26/16.  jkl

## 2016-05-10 ENCOUNTER — Other Ambulatory Visit: Payer: Self-pay | Admitting: Neurosurgery

## 2016-05-10 DIAGNOSIS — M5416 Radiculopathy, lumbar region: Secondary | ICD-10-CM

## 2016-05-19 ENCOUNTER — Other Ambulatory Visit: Payer: Self-pay | Admitting: Neurosurgery

## 2016-05-19 ENCOUNTER — Ambulatory Visit
Admission: RE | Admit: 2016-05-19 | Discharge: 2016-05-19 | Disposition: A | Discharge status: Home / Self Care | Payer: BLUE CROSS/BLUE SHIELD | Source: Ambulatory Visit | Attending: Neurosurgery | Admitting: Neurosurgery

## 2016-05-19 DIAGNOSIS — M5416 Radiculopathy, lumbar region: Secondary | ICD-10-CM

## 2016-05-19 MED ORDER — IOPAMIDOL (ISOVUE-M 200) INJECTION 41%
1.0000 mL | Freq: Once | INTRAMUSCULAR | Status: AC
  Administered 2016-05-19: 1 mL via EPIDURAL

## 2016-05-19 MED ORDER — METHYLPREDNISOLONE ACETATE 40 MG/ML INJ SUSP (RADIOLOG
120.0000 mg | Freq: Once | INTRAMUSCULAR | Status: AC
  Administered 2016-05-19: 120 mg via EPIDURAL

## 2016-05-19 NOTE — Discharge Instructions (Signed)

## 2016-06-06 ENCOUNTER — Other Ambulatory Visit: Payer: Self-pay | Admitting: Neurosurgery

## 2016-06-14 ENCOUNTER — Encounter (HOSPITAL_COMMUNITY): Payer: Self-pay | Admitting: *Deleted

## 2016-06-14 NOTE — Progress Notes (Signed)
Spoke with pt for pre-op call. Pt denies cardiac history, chest pain or sob. Pt is diabetic. States last A1C was 6.9 in May, 2017. Pt states he does not check his blood sugar regularly. States it was probably 3-4 weeks ago that he checked it and it was 132. Instructed pt not to take his Metformin in the AM. Instructed him to check his blood sugar in the AM and every 2 hours prior to leaving for the hospital. If blood sugar is 70 or below, treat with 1/2 cup of clear juice (apple or cranberry) and recheck blood sugar 15 minutes after drinking juice. If blood sugar continues to be 70 or below, call the Short Stay department and ask to speak to a nurse. Pt voiced understanding.

## 2016-06-14 NOTE — Progress Notes (Signed)
   06/14/16 1050  OBSTRUCTIVE SLEEP APNEA  Have you ever been diagnosed with sleep apnea through a sleep study? No  Do you snore loudly (loud enough to be heard through closed doors)?  1  Do you often feel tired, fatigued, or sleepy during the daytime (such as falling asleep during driving or talking to someone)? 0  Has anyone observed you stop breathing during your sleep? 1  Do you have, or are you being treated for high blood pressure? 1  BMI more than 35 kg/m2? 0  Age > 50 (1-yes) 1  Neck circumference greater than:Male 16 inches or larger, Male 17inches or larger? 1  Male Gender (Yes=1) 1  Obstructive Sleep Apnea Score 6  Score 5 or greater  Results sent to PCP

## 2016-06-15 ENCOUNTER — Inpatient Hospital Stay (HOSPITAL_COMMUNITY): Payer: BLUE CROSS/BLUE SHIELD

## 2016-06-15 ENCOUNTER — Encounter (HOSPITAL_COMMUNITY): Payer: Self-pay | Admitting: Surgery

## 2016-06-15 ENCOUNTER — Inpatient Hospital Stay (HOSPITAL_COMMUNITY): Payer: BLUE CROSS/BLUE SHIELD | Admitting: Anesthesiology

## 2016-06-15 ENCOUNTER — Inpatient Hospital Stay (HOSPITAL_COMMUNITY)
Admission: RE | Admit: 2016-06-15 | Discharge: 2016-06-18 | DRG: 473 | Disposition: A | Discharge status: Home / Self Care | Payer: BLUE CROSS/BLUE SHIELD | Source: Ambulatory Visit | Attending: Neurosurgery | Admitting: Neurosurgery

## 2016-06-15 ENCOUNTER — Encounter (HOSPITAL_COMMUNITY)
Admission: RE | Disposition: A | Discharge status: Home / Self Care | Payer: Self-pay | Source: Ambulatory Visit | Attending: Neurosurgery

## 2016-06-15 DIAGNOSIS — Z885 Allergy status to narcotic agent status: Secondary | ICD-10-CM | POA: Diagnosis not present

## 2016-06-15 DIAGNOSIS — Y838 Other surgical procedures as the cause of abnormal reaction of the patient, or of later complication, without mention of misadventure at the time of the procedure: Secondary | ICD-10-CM | POA: Diagnosis present

## 2016-06-15 DIAGNOSIS — Z87891 Personal history of nicotine dependence: Secondary | ICD-10-CM | POA: Diagnosis not present

## 2016-06-15 DIAGNOSIS — I10 Essential (primary) hypertension: Secondary | ICD-10-CM | POA: Diagnosis present

## 2016-06-15 DIAGNOSIS — Z881 Allergy status to other antibiotic agents status: Secondary | ICD-10-CM | POA: Diagnosis not present

## 2016-06-15 DIAGNOSIS — M960 Pseudarthrosis after fusion or arthrodesis: Principal | ICD-10-CM | POA: Diagnosis present

## 2016-06-15 DIAGNOSIS — Y793 Surgical instruments, materials and orthopedic devices (including sutures) associated with adverse incidents: Secondary | ICD-10-CM | POA: Diagnosis present

## 2016-06-15 DIAGNOSIS — K219 Gastro-esophageal reflux disease without esophagitis: Secondary | ICD-10-CM | POA: Diagnosis present

## 2016-06-15 DIAGNOSIS — Z419 Encounter for procedure for purposes other than remedying health state, unspecified: Secondary | ICD-10-CM

## 2016-06-15 DIAGNOSIS — R262 Difficulty in walking, not elsewhere classified: Secondary | ICD-10-CM

## 2016-06-15 DIAGNOSIS — M4722 Other spondylosis with radiculopathy, cervical region: Secondary | ICD-10-CM | POA: Diagnosis present

## 2016-06-15 DIAGNOSIS — Z9889 Other specified postprocedural states: Secondary | ICD-10-CM

## 2016-06-15 DIAGNOSIS — E118 Type 2 diabetes mellitus with unspecified complications: Secondary | ICD-10-CM | POA: Diagnosis present

## 2016-06-15 DIAGNOSIS — M542 Cervicalgia: Secondary | ICD-10-CM | POA: Diagnosis present

## 2016-06-15 DIAGNOSIS — Z7984 Long term (current) use of oral hypoglycemic drugs: Secondary | ICD-10-CM

## 2016-06-15 HISTORY — DX: Personal history of urinary calculi: Z87.442

## 2016-06-15 HISTORY — PX: POSTERIOR CERVICAL FUSION/FORAMINOTOMY: SHX5038

## 2016-06-15 HISTORY — DX: Polyneuropathy, unspecified: G62.9

## 2016-06-15 HISTORY — DX: Gastro-esophageal reflux disease without esophagitis: K21.9

## 2016-06-15 LAB — GLUCOSE, CAPILLARY
Glucose-Capillary: 96 mg/dL (ref 65–99)
Glucose-Capillary: 131 mg/dL — ABNORMAL HIGH (ref 65–99)
Glucose-Capillary: 167 mg/dL — ABNORMAL HIGH (ref 65–99)

## 2016-06-15 LAB — CBC
HCT: 43.4 % (ref 39.0–52.0)
Hemoglobin: 14.6 g/dL (ref 13.0–17.0)
MCH: 30.4 pg (ref 26.0–34.0)
MCHC: 33.6 g/dL (ref 30.0–36.0)
MCV: 90.2 fL (ref 78.0–100.0)
Platelets: 217 K/uL (ref 150–400)
RBC: 4.81 MIL/uL (ref 4.22–5.81)
RDW: 13.2 % (ref 11.5–15.5)
WBC: 5.9 K/uL (ref 4.0–10.5)

## 2016-06-15 LAB — TYPE AND SCREEN
ABO/RH(D): A POS
Antibody Screen: NEGATIVE

## 2016-06-15 LAB — BASIC METABOLIC PANEL
Anion gap: 10 (ref 5–15)
BUN: 17 mg/dL (ref 6–20)
CALCIUM: 9.3 mg/dL (ref 8.9–10.3)
CO2: 23 mmol/L (ref 22–32)
CREATININE: 1.11 mg/dL (ref 0.61–1.24)
Chloride: 105 mmol/L (ref 101–111)
GLUCOSE: 102 mg/dL — AB (ref 65–99)
Potassium: 4.3 mmol/L (ref 3.5–5.1)
Sodium: 138 mmol/L (ref 135–145)

## 2016-06-15 LAB — SURGICAL PCR SCREEN
MRSA, PCR: NEGATIVE
Staphylococcus aureus: NEGATIVE

## 2016-06-15 LAB — ABO/RH: ABO/RH(D): A POS

## 2016-06-15 SURGERY — POSTERIOR CERVICAL FUSION/FORAMINOTOMY LEVEL 4
Anesthesia: General

## 2016-06-15 MED ORDER — CYCLOBENZAPRINE HCL 10 MG PO TABS
10.0000 mg | ORAL_TABLET | Freq: Three times a day (TID) | ORAL | Status: DC | PRN
  Administered 2016-06-15 – 2016-06-17 (×3): 10 mg via ORAL
  Filled 2016-06-15 (×3): qty 1

## 2016-06-15 MED ORDER — MORPHINE SULFATE 2 MG/ML IV SOLN
INTRAVENOUS | Status: DC
  Administered 2016-06-15: 20:00:00 via INTRAVENOUS
  Administered 2016-06-16: 3 mg via INTRAVENOUS
  Administered 2016-06-16: 10.5 mg via INTRAVENOUS
  Administered 2016-06-17: 05:00:00 via INTRAVENOUS
  Filled 2016-06-15: qty 25

## 2016-06-15 MED ORDER — METHYLENE BLUE 0.5 % INJ SOLN
INTRAVENOUS | Status: AC
  Filled 2016-06-15: qty 10

## 2016-06-15 MED ORDER — LIDOCAINE HCL (CARDIAC) 20 MG/ML IV SOLN
INTRAVENOUS | Status: DC | PRN
  Administered 2016-06-15: 20 mg via INTRAVENOUS

## 2016-06-15 MED ORDER — ESMOLOL HCL 100 MG/10ML IV SOLN
INTRAVENOUS | Status: DC | PRN
  Administered 2016-06-15: 20 mg via INTRAVENOUS

## 2016-06-15 MED ORDER — PHENOL 1.4 % MT LIQD
1.0000 | OROMUCOSAL | Status: DC | PRN
Start: 2016-06-15 — End: 2016-06-18

## 2016-06-15 MED ORDER — ARTIFICIAL TEARS OP OINT
TOPICAL_OINTMENT | OPHTHALMIC | Status: DC | PRN
  Administered 2016-06-15: 1 via OPHTHALMIC

## 2016-06-15 MED ORDER — ONDANSETRON HCL 4 MG/2ML IJ SOLN
INTRAMUSCULAR | Status: AC
  Filled 2016-06-15: qty 2

## 2016-06-15 MED ORDER — PHENOL 1.4 % MT LIQD: 1.0000 | OROMUCOSAL | Status: DC | PRN

## 2016-06-15 MED ORDER — MORPHINE SULFATE 2 MG/ML IV SOLN
INTRAVENOUS | Status: AC
  Filled 2016-06-15: qty 25

## 2016-06-15 MED ORDER — METHYLPREDNISOLONE ACETATE 80 MG/ML IJ SUSP
INTRAMUSCULAR | Status: AC
  Filled 2016-06-15: qty 1

## 2016-06-15 MED ORDER — CHLORHEXIDINE GLUCONATE CLOTH 2 % EX PADS: 6.0000 | MEDICATED_PAD | Freq: Once | CUTANEOUS | Status: DC

## 2016-06-15 MED ORDER — CYCLOBENZAPRINE HCL 10 MG PO TABS: 10.0000 mg | ORAL_TABLET | Freq: Three times a day (TID) | ORAL | Status: DC | PRN

## 2016-06-15 MED ORDER — MIDAZOLAM HCL 2 MG/2ML IJ SOLN: 0.5000 mg | Freq: Once | INTRAMUSCULAR | Status: DC | PRN

## 2016-06-15 MED ORDER — HEMOSTATIC AGENTS (NO CHARGE) OPTIME
TOPICAL | Status: DC | PRN
  Administered 2016-06-15: 1 via TOPICAL

## 2016-06-15 MED ORDER — THROMBIN 5000 UNITS EX SOLR
CUTANEOUS | Status: DC | PRN
  Administered 2016-06-15: 17:00:00 via TOPICAL

## 2016-06-15 MED ORDER — 0.9 % SODIUM CHLORIDE (POUR BTL) OPTIME
TOPICAL | Status: DC | PRN
  Administered 2016-06-15: 1000 mL

## 2016-06-15 MED ORDER — POVIDONE-IODINE 10 % EX OINT
TOPICAL_OINTMENT | CUTANEOUS | Status: DC | PRN
Start: 2016-06-15 — End: 2016-06-15
  Administered 2016-06-15: 1 via TOPICAL

## 2016-06-15 MED ORDER — LIDOCAINE-EPINEPHRINE (PF) 2 %-1:200000 IJ SOLN
INTRAMUSCULAR | Status: AC
  Filled 2016-06-15: qty 20

## 2016-06-15 MED ORDER — SUGAMMADEX SODIUM 200 MG/2ML IV SOLN
INTRAVENOUS | Status: AC
  Filled 2016-06-15: qty 2

## 2016-06-15 MED ORDER — LIDOCAINE 2% (20 MG/ML) 5 ML SYRINGE
INTRAMUSCULAR | Status: AC
  Filled 2016-06-15: qty 5

## 2016-06-15 MED ORDER — ROCURONIUM BROMIDE 100 MG/10ML IV SOLN
INTRAVENOUS | Status: DC | PRN
  Administered 2016-06-15 (×2): 50 mg via INTRAVENOUS
  Administered 2016-06-15: 20 mg via INTRAVENOUS

## 2016-06-15 MED ORDER — ALPRAZOLAM 0.5 MG PO TABS
1.0000 mg | ORAL_TABLET | Freq: Three times a day (TID) | ORAL | Status: DC
  Administered 2016-06-15 – 2016-06-18 (×7): 1 mg via ORAL
  Filled 2016-06-15 (×7): qty 2

## 2016-06-15 MED ORDER — VANCOMYCIN HCL IN DEXTROSE 1-5 GM/200ML-% IV SOLN
INTRAVENOUS | Status: AC
  Filled 2016-06-15: qty 200

## 2016-06-15 MED ORDER — HYDROMORPHONE HCL 2 MG/ML IJ SOLN
INTRAMUSCULAR | Status: AC
  Filled 2016-06-15: qty 1

## 2016-06-15 MED ORDER — PROPOFOL 10 MG/ML IV BOLUS
INTRAVENOUS | Status: AC
  Filled 2016-06-15: qty 20

## 2016-06-15 MED ORDER — DIPHENHYDRAMINE HCL 50 MG/ML IJ SOLN: 12.5000 mg | Freq: Four times a day (QID) | INTRAMUSCULAR | Status: DC | PRN

## 2016-06-15 MED ORDER — SENNA 8.6 MG PO TABS: 1.0000 | ORAL_TABLET | Freq: Two times a day (BID) | ORAL | Status: DC

## 2016-06-15 MED ORDER — PROMETHAZINE HCL 25 MG/ML IJ SOLN: 6.2500 mg | INTRAMUSCULAR | Status: DC | PRN

## 2016-06-15 MED ORDER — ONDANSETRON HCL 4 MG/2ML IJ SOLN
4.0000 mg | INTRAMUSCULAR | Status: DC | PRN
Start: 2016-06-15 — End: 2016-06-15

## 2016-06-15 MED ORDER — SODIUM CHLORIDE 0.9 % IV SOLN: 250.0000 mL | INTRAVENOUS | Status: DC

## 2016-06-15 MED ORDER — VANCOMYCIN HCL IN DEXTROSE 1-5 GM/200ML-% IV SOLN
1000.0000 mg | INTRAVENOUS | Status: AC
  Administered 2016-06-15: 1000 mg via INTRAVENOUS

## 2016-06-15 MED ORDER — ONDANSETRON HCL 4 MG/2ML IJ SOLN
INTRAMUSCULAR | Status: DC | PRN
  Administered 2016-06-15: 4 mg via INTRAVENOUS

## 2016-06-15 MED ORDER — LACTATED RINGERS IV SOLN
INTRAVENOUS | Status: DC
Start: 2016-06-15 — End: 2016-06-18
  Administered 2016-06-15: 11:00:00 via INTRAVENOUS

## 2016-06-15 MED ORDER — MIDAZOLAM HCL 2 MG/2ML IJ SOLN
INTRAMUSCULAR | Status: AC
  Filled 2016-06-15: qty 2

## 2016-06-15 MED ORDER — MAGNESIUM GLUCONATE 500 MG PO TABS
250.0000 mg | ORAL_TABLET | Freq: Every day | ORAL | Status: DC
  Filled 2016-06-15: qty 1

## 2016-06-15 MED ORDER — FENTANYL CITRATE (PF) 100 MCG/2ML IJ SOLN
INTRAMUSCULAR | Status: AC
  Filled 2016-06-15: qty 4

## 2016-06-15 MED ORDER — EPHEDRINE SULFATE 50 MG/ML IJ SOLN
INTRAMUSCULAR | Status: DC | PRN
  Administered 2016-06-15 (×2): 10 mg via INTRAVENOUS

## 2016-06-15 MED ORDER — SUGAMMADEX SODIUM 200 MG/2ML IV SOLN
INTRAVENOUS | Status: DC | PRN
  Administered 2016-06-15: 200 mg via INTRAVENOUS

## 2016-06-15 MED ORDER — MUPIROCIN 2 % EX OINT
TOPICAL_OINTMENT | CUTANEOUS | Status: AC
  Filled 2016-06-15: qty 22

## 2016-06-15 MED ORDER — PHENYLEPHRINE HCL 10 MG/ML IJ SOLN
INTRAMUSCULAR | Status: DC | PRN
  Administered 2016-06-15: 40 ug via INTRAVENOUS

## 2016-06-15 MED ORDER — DIPHENHYDRAMINE HCL 12.5 MG/5ML PO ELIX: 12.5000 mg | ORAL_SOLUTION | Freq: Four times a day (QID) | ORAL | Status: DC | PRN

## 2016-06-15 MED ORDER — SODIUM CHLORIDE 0.9% FLUSH
3.0000 mL | Freq: Two times a day (BID) | INTRAVENOUS | Status: DC
  Administered 2016-06-16 – 2016-06-17 (×3): 3 mL via INTRAVENOUS

## 2016-06-15 MED ORDER — SIMVASTATIN 40 MG PO TABS
40.0000 mg | ORAL_TABLET | Freq: Every day | ORAL | Status: DC
  Administered 2016-06-15 – 2016-06-18 (×4): 40 mg via ORAL
  Filled 2016-06-15 (×4): qty 1

## 2016-06-15 MED ORDER — MENTHOL 3 MG MT LOZG: 1.0000 | LOZENGE | OROMUCOSAL | Status: DC | PRN

## 2016-06-15 MED ORDER — THROMBIN 5000 UNITS EX SOLR
CUTANEOUS | Status: AC
  Filled 2016-06-15: qty 10000

## 2016-06-15 MED ORDER — ZOLPIDEM TARTRATE 5 MG PO TABS: 5.0000 mg | ORAL_TABLET | Freq: Every evening | ORAL | Status: DC | PRN

## 2016-06-15 MED ORDER — SENNA 8.6 MG PO TABS
1.0000 | ORAL_TABLET | Freq: Two times a day (BID) | ORAL | Status: DC
  Administered 2016-06-15 – 2016-06-18 (×6): 8.6 mg via ORAL
  Filled 2016-06-15 (×6): qty 1

## 2016-06-15 MED ORDER — RAMIPRIL 10 MG PO CAPS
10.0000 mg | ORAL_CAPSULE | Freq: Every day | ORAL | Status: DC
  Administered 2016-06-17 – 2016-06-18 (×2): 10 mg via ORAL
  Filled 2016-06-15 (×4): qty 1

## 2016-06-15 MED ORDER — PANTOPRAZOLE SODIUM 40 MG PO TBEC
40.0000 mg | DELAYED_RELEASE_TABLET | Freq: Every day | ORAL | Status: DC
  Administered 2016-06-16 – 2016-06-18 (×3): 40 mg via ORAL
  Filled 2016-06-15 (×3): qty 1

## 2016-06-15 MED ORDER — EPINEPHRINE PF 1 MG/ML IJ SOLN
INTRAMUSCULAR | Status: AC
  Filled 2016-06-15: qty 1

## 2016-06-15 MED ORDER — SODIUM CHLORIDE 0.9% FLUSH
3.0000 mL | Freq: Two times a day (BID) | INTRAVENOUS | Status: DC
  Administered 2016-06-16 – 2016-06-17 (×3): 3 mL via INTRAVENOUS

## 2016-06-15 MED ORDER — VANCOMYCIN HCL 1000 MG IV SOLR
INTRAVENOUS | Status: DC | PRN
  Administered 2016-06-15: 1000 mg via TOPICAL

## 2016-06-15 MED ORDER — SODIUM CHLORIDE 0.9% FLUSH
3.0000 mL | INTRAVENOUS | Status: DC | PRN
Start: 2016-06-15 — End: 2016-06-18

## 2016-06-15 MED ORDER — ACETAMINOPHEN 650 MG RE SUPP: 650.0000 mg | RECTAL | Status: DC | PRN

## 2016-06-15 MED ORDER — MEPERIDINE HCL 25 MG/ML IJ SOLN: 6.2500 mg | INTRAMUSCULAR | Status: DC | PRN

## 2016-06-15 MED ORDER — METFORMIN HCL 500 MG PO TABS
500.0000 mg | ORAL_TABLET | Freq: Two times a day (BID) | ORAL | Status: DC
  Administered 2016-06-16 – 2016-06-18 (×4): 500 mg via ORAL
  Filled 2016-06-15 (×3): qty 1

## 2016-06-15 MED ORDER — ONDANSETRON HCL 4 MG/2ML IJ SOLN: 4.0000 mg | INTRAMUSCULAR | Status: DC | PRN

## 2016-06-15 MED ORDER — NALOXONE HCL 0.4 MG/ML IJ SOLN: 0.4000 mg | INTRAMUSCULAR | Status: DC | PRN

## 2016-06-15 MED ORDER — SODIUM CHLORIDE 0.9% FLUSH: 3.0000 mL | INTRAVENOUS | Status: DC | PRN

## 2016-06-15 MED ORDER — THROMBIN 5000 UNITS EX SOLR
CUTANEOUS | Status: DC | PRN
  Administered 2016-06-15 (×2): 5000 [IU] via TOPICAL

## 2016-06-15 MED ORDER — MAGNESIUM 250 MG PO TABS: 250.0000 mg | ORAL_TABLET | Freq: Every evening | ORAL | Status: DC

## 2016-06-15 MED ORDER — PHENYLEPHRINE HCL 10 MG/ML IJ SOLN
INTRAVENOUS | Status: DC | PRN
  Administered 2016-06-15: 10 ug/min via INTRAVENOUS

## 2016-06-15 MED ORDER — LIDOCAINE-EPINEPHRINE (PF) 2 %-1:200000 IJ SOLN
INTRAMUSCULAR | Status: DC | PRN
  Administered 2016-06-15: 18 mL via INTRADERMAL

## 2016-06-15 MED ORDER — VANCOMYCIN HCL IN DEXTROSE 1-5 GM/200ML-% IV SOLN
1000.0000 mg | Freq: Two times a day (BID) | INTRAVENOUS | Status: DC
  Administered 2016-06-16 – 2016-06-18 (×4): 1000 mg via INTRAVENOUS
  Filled 2016-06-15 (×7): qty 200

## 2016-06-15 MED ORDER — THROMBIN 5000 UNITS EX SOLR
CUTANEOUS | Status: AC
  Filled 2016-06-15: qty 5000

## 2016-06-15 MED ORDER — GLYCOPYRROLATE 0.2 MG/ML IJ SOLN
INTRAMUSCULAR | Status: DC | PRN
  Administered 2016-06-15: 0.2 mg via INTRAVENOUS

## 2016-06-15 MED ORDER — POVIDONE-IODINE 10 % EX OINT
TOPICAL_OINTMENT | CUTANEOUS | Status: AC
  Filled 2016-06-15: qty 28.35

## 2016-06-15 MED ORDER — SODIUM CHLORIDE 0.9 % IV SOLN
INTRAVENOUS | Status: DC
  Administered 2016-06-16 – 2016-06-18 (×2): via INTRAVENOUS

## 2016-06-15 MED ORDER — CITALOPRAM HYDROBROMIDE 20 MG PO TABS
20.0000 mg | ORAL_TABLET | Freq: Every evening | ORAL | Status: DC
  Administered 2016-06-15 – 2016-06-17 (×2): 20 mg via ORAL
  Filled 2016-06-15 (×2): qty 1

## 2016-06-15 MED ORDER — DEXTROSE 5 % IV SOLN
INTRAVENOUS | Status: DC | PRN
  Administered 2016-06-15: 15:00:00 via INTRAVENOUS

## 2016-06-15 MED ORDER — VANCOMYCIN HCL 1000 MG IV SOLR
INTRAVENOUS | Status: AC
  Filled 2016-06-15: qty 1000

## 2016-06-15 MED ORDER — MUPIROCIN 2 % EX OINT: 1.0000 "application " | TOPICAL_OINTMENT | Freq: Once | CUTANEOUS | Status: DC

## 2016-06-15 MED ORDER — SODIUM CHLORIDE 0.9% FLUSH: 9.0000 mL | INTRAVENOUS | Status: DC | PRN

## 2016-06-15 MED ORDER — MIDAZOLAM HCL 5 MG/5ML IJ SOLN
INTRAMUSCULAR | Status: DC | PRN
  Administered 2016-06-15: 2 mg via INTRAVENOUS

## 2016-06-15 MED ORDER — SODIUM CHLORIDE 0.9 % IV SOLN: INTRAVENOUS | Status: DC

## 2016-06-15 MED ORDER — FENTANYL CITRATE (PF) 100 MCG/2ML IJ SOLN
INTRAMUSCULAR | Status: DC | PRN
  Administered 2016-06-15 (×2): 50 ug via INTRAVENOUS
  Administered 2016-06-15: 150 ug via INTRAVENOUS
  Administered 2016-06-15 (×2): 50 ug via INTRAVENOUS

## 2016-06-15 MED ORDER — HYDROMORPHONE HCL 1 MG/ML IJ SOLN
0.2500 mg | INTRAMUSCULAR | Status: DC | PRN
  Administered 2016-06-15 (×4): 0.5 mg via INTRAVENOUS

## 2016-06-15 MED ORDER — ONDANSETRON HCL 4 MG/2ML IJ SOLN: 4.0000 mg | Freq: Four times a day (QID) | INTRAMUSCULAR | Status: DC | PRN

## 2016-06-15 MED ORDER — ACETAMINOPHEN 325 MG PO TABS: 650.0000 mg | ORAL_TABLET | ORAL | Status: DC | PRN

## 2016-06-15 MED ORDER — PROPOFOL 10 MG/ML IV BOLUS
INTRAVENOUS | Status: DC | PRN
  Administered 2016-06-15: 40 mg via INTRAVENOUS
  Administered 2016-06-15: 110 mg via INTRAVENOUS

## 2016-06-15 MED ORDER — LACTATED RINGERS IV SOLN
INTRAVENOUS | Status: DC | PRN
  Administered 2016-06-15 (×2): via INTRAVENOUS

## 2016-06-15 MED ORDER — ACETAMINOPHEN 325 MG PO TABS
650.0000 mg | ORAL_TABLET | ORAL | Status: DC | PRN
  Administered 2016-06-17: 650 mg via ORAL
  Filled 2016-06-15: qty 2

## 2016-06-15 MED ORDER — ROCURONIUM BROMIDE 10 MG/ML (PF) SYRINGE
PREFILLED_SYRINGE | INTRAVENOUS | Status: AC
  Filled 2016-06-15: qty 10

## 2016-06-15 SURGICAL SUPPLY — 84 items
BENZOIN TINCTURE PRP APPL 2/3 (GAUZE/BANDAGES/DRESSINGS) ×3 IMPLANT
BIT DRILL 3.5 SHORT (BIT) ×2 IMPLANT
BIT DRILL 3.5MM SHORT (BIT) ×1
BLADE CLIPPER SURG (BLADE) ×3 IMPLANT
BLADE SURG 11 STRL SS (BLADE) IMPLANT
BLADE ULTRA TIP 2M (BLADE) ×3 IMPLANT
BUR MATCHSTICK NEURO 3.0 LAGG (BURR) ×3 IMPLANT
CANISTER SUCT 3000ML PPV (MISCELLANEOUS) ×3 IMPLANT
CAP LOCKING (Cap) ×10 IMPLANT
CARTRIDGE OIL MAESTRO DRILL (MISCELLANEOUS) ×1 IMPLANT
CLOSURE WOUND 1/2 X4 (GAUZE/BANDAGES/DRESSINGS) ×1
COVER MAYO STAND STRL (DRAPES) IMPLANT
DECANTER SPIKE VIAL GLASS SM (MISCELLANEOUS) ×3 IMPLANT
DIFFUSER DRILL AIR PNEUMATIC (MISCELLANEOUS) ×3 IMPLANT
DRAPE C-ARM 42X72 X-RAY (DRAPES) ×6 IMPLANT
DRAPE LAPAROTOMY 100X72 PEDS (DRAPES) ×3 IMPLANT
DRAPE MICROSCOPE LEICA (MISCELLANEOUS) IMPLANT
DRAPE ORTHO SPLIT 77X108 STRL (DRAPES)
DRAPE POUCH INSTRU U-SHP 10X18 (DRAPES) ×3 IMPLANT
DRAPE SURG ORHT 6 SPLT 77X108 (DRAPES) IMPLANT
DRSG OPSITE POSTOP 4X6 (GAUZE/BANDAGES/DRESSINGS) ×3 IMPLANT
DURAPREP 6ML APPLICATOR 50/CS (WOUND CARE) ×3 IMPLANT
ELECT BLADE 4.0 EZ CLEAN MEGAD (MISCELLANEOUS) ×3
ELECT REM PT RETURN 9FT ADLT (ELECTROSURGICAL) ×3
ELECTRODE BLDE 4.0 EZ CLN MEGD (MISCELLANEOUS) ×1 IMPLANT
ELECTRODE REM PT RTRN 9FT ADLT (ELECTROSURGICAL) ×1 IMPLANT
EVACUATOR 1/8 PVC DRAIN (DRAIN) ×3 IMPLANT
GAUZE SPONGE 4X4 12PLY STRL (GAUZE/BANDAGES/DRESSINGS) IMPLANT
GAUZE SPONGE 4X4 16PLY XRAY LF (GAUZE/BANDAGES/DRESSINGS) IMPLANT
GLOVE BIO SURGEON STRL SZ 6.5 (GLOVE) ×4 IMPLANT
GLOVE BIO SURGEONS STRL SZ 6.5 (GLOVE) ×2
GLOVE BIOGEL M 8.0 STRL (GLOVE) ×3 IMPLANT
GLOVE BIOGEL PI IND STRL 6.5 (GLOVE) ×1 IMPLANT
GLOVE BIOGEL PI IND STRL 7.5 (GLOVE) ×2 IMPLANT
GLOVE BIOGEL PI INDICATOR 6.5 (GLOVE) ×2
GLOVE BIOGEL PI INDICATOR 7.5 (GLOVE) ×4
GLOVE ECLIPSE 6.5 STRL STRAW (GLOVE) ×3 IMPLANT
GLOVE EXAM NITRILE LRG STRL (GLOVE) IMPLANT
GLOVE EXAM NITRILE XL STR (GLOVE) IMPLANT
GLOVE EXAM NITRILE XS STR PU (GLOVE) IMPLANT
GOWN STRL REUS W/ TWL LRG LVL3 (GOWN DISPOSABLE) ×3 IMPLANT
GOWN STRL REUS W/ TWL XL LVL3 (GOWN DISPOSABLE) IMPLANT
GOWN STRL REUS W/TWL 2XL LVL3 (GOWN DISPOSABLE) IMPLANT
GOWN STRL REUS W/TWL LRG LVL3 (GOWN DISPOSABLE) ×6
GOWN STRL REUS W/TWL XL LVL3 (GOWN DISPOSABLE)
HEMOSTAT POWDER KIT SURGIFOAM (HEMOSTASIS) ×3 IMPLANT
IMPL QUARTEX 3.5X12MM (Neuro Prosthesis/Implant) ×9 IMPLANT
IMPLANT QUARTEX 3.5X12MM (Neuro Prosthesis/Implant) ×27 IMPLANT
KIT BASIN OR (CUSTOM PROCEDURE TRAY) ×3 IMPLANT
KIT INFUSE MEDIUM (Orthopedic Implant) ×3 IMPLANT
KIT ROOM TURNOVER OR (KITS) ×3 IMPLANT
LOCKING CAP (Cap) ×30 IMPLANT
MARKER SKIN DUAL TIP RULER LAB (MISCELLANEOUS) ×3 IMPLANT
NEEDLE HYPO 18GX1.5 BLUNT FILL (NEEDLE) ×3 IMPLANT
NEEDLE HYPO 25X1 1.5 SAFETY (NEEDLE) ×3 IMPLANT
NS IRRIG 1000ML POUR BTL (IV SOLUTION) ×3 IMPLANT
OIL CARTRIDGE MAESTRO DRILL (MISCELLANEOUS) ×3
PACK LAMINECTOMY NEURO (CUSTOM PROCEDURE TRAY) ×3 IMPLANT
PAD ARMBOARD 7.5X6 YLW CONV (MISCELLANEOUS) ×9 IMPLANT
PATTIES SURGICAL .25X.25 (GAUZE/BANDAGES/DRESSINGS) IMPLANT
PIN MAYFIELD SKULL DISP (PIN) ×3 IMPLANT
QUARTEX IMPLANT 3.5X18MM (Neuro Prosthesis/Implant) ×3 IMPLANT
ROD STRAIGHT 120MMX4MM (Rod) ×6 IMPLANT
RUBBERBAND STERILE (MISCELLANEOUS) IMPLANT
SCREW QUARTEX IMPLANT 3.5X18MM (Neuro Prosthesis/Implant) ×1 IMPLANT
SPONGE GAUZE 4X4 12PLY STER LF (GAUZE/BANDAGES/DRESSINGS) ×3 IMPLANT
SPONGE LAP 4X18 X RAY DECT (DISPOSABLE) IMPLANT
STAPLER SKIN PROX WIDE 3.9 (STAPLE) IMPLANT
STRIP CLOSURE SKIN 1/2X4 (GAUZE/BANDAGES/DRESSINGS) ×2 IMPLANT
SUT ETHILON 3 0 FSL (SUTURE) ×3 IMPLANT
SUT ETHILON 4 0 PS 2 18 (SUTURE) ×3 IMPLANT
SUT VIC AB 0 CT1 18XCR BRD 8 (SUTURE) ×1 IMPLANT
SUT VIC AB 0 CT1 18XCR BRD8 (SUTURE) ×1 IMPLANT
SUT VIC AB 0 CT1 8-18 (SUTURE) ×4
SUT VIC AB 2-0 CP2 18 (SUTURE) ×3 IMPLANT
SUT VIC AB 3-0 SH 8-18 (SUTURE) ×3 IMPLANT
SYR 3ML LL SCALE MARK (SYRINGE) IMPLANT
SYR CONTROL 10ML LL (SYRINGE) ×3 IMPLANT
TAPE CLOTH SURG 4X10 WHT LF (GAUZE/BANDAGES/DRESSINGS) ×3 IMPLANT
TOWEL OR 17X24 6PK STRL BLUE (TOWEL DISPOSABLE) ×3 IMPLANT
TOWEL OR 17X26 10 PK STRL BLUE (TOWEL DISPOSABLE) ×3 IMPLANT
TRAY FOLEY W/METER SILVER 16FR (SET/KITS/TRAYS/PACK) ×3 IMPLANT
UNDERPAD 30X30 (UNDERPADS AND DIAPERS) IMPLANT
WATER STERILE IRR 1000ML POUR (IV SOLUTION) ×3 IMPLANT

## 2016-06-15 NOTE — Progress Notes (Signed)
Pharmacy Antibiotic Note  Jon Wong is a 55 y.o. male admitted on 06/15/2016 to continue vancomycin s/p C3-C7 spinal fusion (drain in place). Received 1g x1 ~1500 today.    Plan: -Vancomycin 1g q12h -Monitor renal fxn, clinical course, LOT  Height: 5\' 9"  (175.3 cm) Weight: 183 lb (83 kg) IBW/kg (Calculated) : 70.7  Temp (24hrs), Avg:97.9 F (36.6 C), Min:97.6 F (36.4 C), Max:98.5 F (36.9 C)   Recent Labs Lab 06/15/16 1049  WBC 5.9  CREATININE 1.11    Estimated Creatinine Clearance: 77 mL/min (by C-G formula based on SCr of 1.11 mg/dL).    Allergies  Allergen Reactions  . Amoxicillin Itching and Swelling    Has patient had a PCN reaction causing immediate rash, facial/tongue/throat swelling, SOB or lightheadedness with hypotension:No Has patient had a PCN reaction causing severe rash involving mucus membranes or skin necrosis:No Has patient had a PCN reaction that required hospitalization:No Has patient had a PCN reaction occurring within the last 10 years:Yes If all of the above answers are "NO", then may proceed with Cephalosporin use.    Marland Kitchen. Hydrocodone Itching    Patient tolerates in small doses.    Thank you for allowing pharmacy to be a part of this patient's care.  Jon Wong Jon Wong, PharmD Clinical Pharmacist 9:14 PM, 06/15/2016

## 2016-06-15 NOTE — Anesthesia Preprocedure Evaluation (Addendum)
Anesthesia Evaluation  Patient identified by MRN, date of birth, ID band Patient awake    Reviewed: Allergy & Precautions, NPO status , Patient's Chart, lab work & pertinent test results  History of Anesthesia Complications Negative for: history of anesthetic complications  Airway Mallampati: I  TM Distance: >3 FB Neck ROM: Full    Dental  (+) Edentulous Upper, Dental Advisory Given, Edentulous Lower   Pulmonary former smoker (quit 2012),    breath sounds clear to auscultation       Cardiovascular hypertension, Pt. on medications (-) angina Rhythm:Regular Rate:Normal     Neuro/Psych  Headaches, Anxiety    GI/Hepatic Neg liver ROS, GERD  Medicated and Controlled,  Endo/Other  diabetes (glu 102), Oral Hypoglycemic Agents  Renal/GU negative Renal ROS     Musculoskeletal   Abdominal   Peds  Hematology   Anesthesia Other Findings   Reproductive/Obstetrics                            Anesthesia Physical Anesthesia Plan  ASA: III  Anesthesia Plan: General   Post-op Pain Management:    Induction: Intravenous  Airway Management Planned: Oral ETT  Additional Equipment:   Intra-op Plan:   Post-operative Plan: Extubation in OR  Informed Consent: I have reviewed the patients History and Physical, chart, labs and discussed the procedure including the risks, benefits and alternatives for the proposed anesthesia with the patient or authorized representative who has indicated his/her understanding and acceptance.   Dental advisory given  Plan Discussed with: CRNA and Surgeon  Anesthesia Plan Comments: (Plan routine monitors, GETA)        Anesthesia Quick Evaluation

## 2016-06-15 NOTE — Progress Notes (Signed)
Assessment completed at 1420

## 2016-06-15 NOTE — Transfer of Care (Signed)
Immediate Anesthesia Transfer of Care Note  Patient: Jon Wong  Procedure(s) Performed: Procedure(s) with comments: CERVICAL THREE- FOUR CERVICAL FOUR- FIVE CERVICAL FIVE-SIX CERVICAL SIX- SEVEN POSTERIOR CERVICAL FUSION WITH LATERAL MASS FIXATION (N/A) - C3-4 C4-5 C5-6 C6-7 POSTERIOR CERVICAL FUSION WITH LATERAL MASS FIXATION  Patient Location: PACU  Anesthesia Type:General  Level of Consciousness: awake, alert  and oriented  Airway & Oxygen Therapy: Patient Spontanous Breathing and Patient connected to face mask oxygen  Post-op Assessment: Report given to RN and Post -op Vital signs reviewed and stable  Post vital signs: Reviewed and stable  Last Vitals:  Vitals:   06/15/16 1034  BP: 132/86  Pulse: 60  Resp: 20  Temp: 36.9 C    Last Pain:  Vitals:   06/15/16 1054  TempSrc:   PainSc: 2       Patients Stated Pain Goal: 4 (06/15/16 1054)  Complications: No apparent anesthesia complications

## 2016-06-15 NOTE — Anesthesia Procedure Notes (Addendum)
Procedure Name: Intubation Date/Time: 06/15/2016 3:24 PM Performed by: Wray KearnsFOLEY, Jon Wong A Pre-anesthesia Checklist: Patient identified, Emergency Drugs available, Suction available and Patient being monitored Patient Re-evaluated:Patient Re-evaluated prior to inductionOxygen Delivery Method: Circle System Utilized and Circle system utilized Preoxygenation: Pre-oxygenation with 100% oxygen Intubation Type: IV induction and Cricoid Pressure applied Ventilation: Mask ventilation without difficulty and Oral airway inserted - appropriate to patient size Laryngoscope Size: Mac and 4 Grade View: Grade I Tube type: Oral Number of attempts: 1 Airway Equipment and Method: Stylet and Oral airway Placement Confirmation: ETT inserted through vocal cords under direct vision,  positive ETCO2 and breath sounds checked- equal and bilateral Secured at: 23 cm Tube secured with: Tape Dental Injury: Teeth and Oropharynx as per pre-operative assessment

## 2016-06-16 LAB — GLUCOSE, CAPILLARY: Glucose-Capillary: 146 mg/dL — ABNORMAL HIGH (ref 65–99)

## 2016-06-16 LAB — HEMOGLOBIN A1C
HEMOGLOBIN A1C: 6.7 % — AB (ref 4.8–5.6)
Mean Plasma Glucose: 146 mg/dL

## 2016-06-16 MED ORDER — OXYCODONE HCL 5 MG PO TABS
15.0000 mg | ORAL_TABLET | ORAL | Status: DC | PRN
  Administered 2016-06-18: 15 mg via ORAL
  Filled 2016-06-16 (×2): qty 3

## 2016-06-16 MED ORDER — MAGNESIUM OXIDE 400 (241.3 MG) MG PO TABS
400.0000 mg | ORAL_TABLET | Freq: Every day | ORAL | Status: DC
  Administered 2016-06-16 – 2016-06-18 (×3): 400 mg via ORAL
  Filled 2016-06-16 (×3): qty 1

## 2016-06-16 NOTE — Progress Notes (Signed)
Patient ID: Jon MedinaRandy O Wong, male   DOB: 07/26/1960, 55 y.o.   MRN: [id number] Doing well, no weakness. Sensory better. C/o incisional pain. He can take oxycodone

## 2016-06-16 NOTE — Evaluation (Signed)
Physical Therapy Evaluation Patient Details Name: Jon MedinaRandy O Lisle MRN: [id number] DOB: 01/10/1961 Today's Date: 06/16/2016   History of Present Illness  55 y/o male s/p posterior cervical decompression with fusion (C3-7)  Clinical Impression  Pt admitted with above diagnosis. Pt currently with functional limitations due to the deficits listed below (see PT Problem List).  Pt will benefit from skilled PT to increase their independence and safety with mobility to allow discharge to the venue listed below.  Pt very guarded with slow gait pattern.  Will follow acutely, but feel he will progress and not need any follow up PT.     Follow Up Recommendations No PT follow up;Supervision - Intermittent    Equipment Recommendations  None recommended by PT    Recommendations for Other Services       Precautions / Restrictions Precautions Precautions: Cervical Precaution Comments: Issued illustrated hand out Required Braces or Orthoses: Cervical Brace Cervical Brace: Hard collar      Mobility  Bed Mobility Overal bed mobility: Needs Assistance Bed Mobility: Rolling;Sidelying to Sit Rolling: Supervision Sidelying to sit: Min assist       General bed mobility comments: MIN a for trunk and cues for proper technique  Transfers Overall transfer level: Modified independent               General transfer comment: stood from bed and low toilet without difficulty. some dizziness initially which subsided  Ambulation/Gait Ambulation/Gait assistance: Min guard Ambulation Distance (Feet): 60 Feet Assistive device:  (IV pole) Gait Pattern/deviations: Decreased step length - right;Decreased step length - left Gait velocity: decreased   General Gait Details: Pt ambulates very slowly with decreased step length and guarded  Stairs            Wheelchair Mobility    Modified Rankin (Stroke Patients Only)       Balance Overall balance assessment: No apparent balance deficits  (not formally assessed)                                           Pertinent Vitals/Pain Pain Assessment: 0-10 Pain Score: 9  Pain Location: between shoulder blades Pain Descriptors / Indicators: Sore Pain Intervention(s): PCA encouraged;Monitored during session    Home Living Family/patient expects to be discharged to:: Private residence Living Arrangements: Spouse/significant other Available Help at Discharge: Family;Available PRN/intermittently Type of Home: Mobile home Home Access: Stairs to enter Entrance Stairs-Rails: Can reach both Entrance Stairs-Number of Steps: 5 Home Layout: One level Home Equipment: Crutches      Prior Function Level of Independence: Independent         Comments: 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     Hand Dominance   Dominant Hand: Right    Extremity/Trunk Assessment   Upper Extremity Assessment: Defer to OT evaluation           Lower Extremity Assessment: Overall WFL for tasks assessed      Cervical / Trunk Assessment: Other exceptions  Communication   Communication: No difficulties  Cognition Arousal/Alertness: Awake/alert Behavior During Therapy: WFL for tasks assessed/performed Overall Cognitive Status: Within Functional Limits for tasks assessed                      General Comments      Exercises     Assessment/Plan    PT Assessment Patient needs continued  PT services  PT Problem List Decreased activity tolerance;Decreased knowledge of precautions;Decreased mobility          PT Treatment Interventions DME instruction;Gait training;Stair training;Functional mobility training;Therapeutic activities;Therapeutic exercise;Balance training;Patient/family education    PT Goals (Current goals can be found in the Care Plan section)  Acute Rehab PT Goals Patient Stated Goal: go home PT Goal Formulation: With patient Time For Goal Achievement: 06/23/16 Potential to Achieve  Goals: Good    Frequency Min 5X/week   Barriers to discharge        Co-evaluation               End of Session Equipment Utilized During Treatment: Cervical collar;Gait belt Activity Tolerance: Patient tolerated treatment well Patient left: in chair;with call bell/phone within reach;with family/visitor present           Time: [id number] PT Time Calculation (min) (ACUTE ONLY): 31 min   Charges:   PT Evaluation $PT Eval Moderate Complexity: 1 Procedure PT Treatments $Gait Training: 8-22 mins   PT G Codes:        Mekia Dipinto LUBECK 06/16/2016, 1:50 PM

## 2016-06-16 NOTE — H&P (Signed)
NAMShawna Wong:  [name], Wong               ACCOUNT NO.:  [id number]  MEDICAL RECORD NO.:  [id number]  LOCATION:                                 FACILITY:  PHYSICIAN:  Hilda LiasErnesto Chaselyn Nanney, M.D.   DATE OF BIRTH:  1961-03-09  DATE OF ADMISSION:  06/15/2016 DATE OF DISCHARGE:                             HISTORY & PHYSICAL   HISTORY OF PRESENT ILLNESS:  Mr. Jon Wong is a gentleman, who was seen by me in September of this year complaining of neck pain radiation to both upper extremities, associated with tingling sensation and some lower back pain.  The pain is getting more difficult.  In the past, he has an anterior cervical fusion at the level of 4-5 and 5-6.  He also has been complaining of lower back pain radiation to both lower extremities.  We did a conservative treatment, which include injection and he is no any better.  The x-rays showed that he has decompression and fusion with possible subtle arthrosis at the level of cervical 4-5.  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  Because continues of the pain, he wants to proceed with surgery.  PAST MEDICAL HISTORY:  Anterior cervical fusion.  ALLERGIES:  He is allergic to HYDROCODONE and AMOXICILLIN.  MEDICATIONS:  He is taking some medication for his cholesterol, metformin, Celexa, and Dexilant.  FAMILY HISTORY:  Father has a pacemaker.  Mother has a history of fibromyalgia.  SOCIAL HISTORY:  Negative.  PHYSICAL EXAMINATION:  HEAD, NOSE AND THROAT:  Normal. NECK:  He has a scar anteriorly.  He has some pain with mobility. LUNGS:  Clear. HEART:  Sounds normal. ABDOMEN:  Normal. EXTREMITIES:  Normal pulse. NEUROLOGIC:  He has some mild weakness in the iliopsoas and quadriceps. Reflexes 1+.  Sensation normal.  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  RECOMMENDATION:  The patient is going to be admitted for surgery.  The procedure will be a posterior decompression from C3-C7 with fusion using lateral mass screws.  We are going to be using autograft and BMP.  He and his wife are fully aware of the risk with the surgery such as no improvement whatsoever, need for further surgery, infection, paralysis, weakness of the arm.  Also, all the risks associated with the help.    ______________________________ Hilda LiasErnesto Devarius Nelles, M.D.   ______________________________ Hilda LiasErnesto Necia Kamm, M.D.    EB/MEDQ  D:  06/15/2016  T:  06/16/2016  Job:  [id number]

## 2016-06-16 NOTE — Evaluation (Signed)
Occupational Therapy Evaluation Patient Details Name: Jon Wong MRN: [id number] DOB: [birth year] Today's Date: 06/16/2016    History of Present Illness 55 y/o male s/p posterior cervical decompression with fusion (C3-7)   Clinical Impression   Pt with decline in function and safety with ADLs and ADL mobility with decreased balance and endurance. Pt would benefit from acute OT services to address impairments to increase level of function and safety    Follow Up Recommendations  No OT follow up;Supervision - Intermittent    Equipment Recommendations  Other (comment) (ADL A/E kit)    Recommendations for Other Services       Precautions / Restrictions Precautions Precautions: Cervical Precaution Comments: reviewed cervical precatuons Required Braces or Orthoses: Cervical Brace Cervical Brace: Hard collar Restrictions Weight Bearing Restrictions: No      Mobility Bed Mobility Overal bed mobility: Needs Assistance Bed Mobility: Rolling;Sit to Sidelying Rolling: Supervision Sidelying to sit: Min assist     Sit to sidelying: Min assist General bed mobility comments: min A with LEs onto bed  Transfers Overall transfer level: Modified independent Equipment used: None             General transfer comment: reports no dizziness    Balance Overall balance assessment: No apparent balance deficits (not formally assessed)                                          ADL Overall ADL's : Needs assistance/impaired     Grooming: Wash/dry hands;Wash/dry face;Min guard;Standing   Upper Body Bathing: Set up;Sitting;With caregiver independent assisting   Lower Body Bathing: Moderate assistance   Upper Body Dressing : Set up;With caregiver independent assisting;Sitting   Lower Body Dressing: Moderate assistance   Toilet Transfer: Modified Independent;Supervision/safety;Comfort height toilet;Grab bars;RW;Ambulation   Toileting- Clothing  Manipulation and Hygiene: Min guard   Tub/ Shower Transfer: Supervision/safety;3 in English as a second language teacher;Ambulation;Min guard   Functional mobility during ADLs: Supervision/safety;Rolling walker;Min guard General ADL Comments: Initiated ADL A/E education      Vision Vision Assessment?: No apparent visual deficits              Pertinent Vitals/Pain Pain Assessment: 0-10 Pain Score: 9  Pain Location: neck Pain Descriptors / Indicators: Constant;Sore Pain Intervention(s): PCA encouraged;Repositioned;Monitored during session     Hand Dominance Right   Extremity/Trunk Assessment Upper Extremity Assessment Upper Extremity Assessment: Overall WFL for tasks assessed   Lower Extremity Assessment Lower Extremity Assessment: Overall WFL for tasks assessed   Cervical / Trunk Assessment Cervical / Trunk Assessment: Other exceptions Cervical / Trunk Exceptions: Aspen brace intact   Communication Communication Communication: No difficulties   Cognition Arousal/Alertness: Awake/alert Behavior During Therapy: WFL for tasks assessed/performed Overall Cognitive Status: Within Functional Limits for tasks assessed                     General Comments   pt very pleasant and cooperative                 Home Living Family/patient expects to be discharged to:: Private residence Living Arrangements: Spouse/significant other Available Help at Discharge: Family;Available PRN/intermittently Type of Home: Mobile home Home Access: Stairs to enter Entrance Stairs-Number of Steps: 5 Entrance Stairs-Rails: Can reach both Home Layout: One level     Bathroom Shower/Tub: Teacher, early years/pre: Standard     Home Equipment: Crutches  Prior Functioning/Environment Level of Independence: Independent        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        OT Problem List: Impaired balance (sitting and/or standing);Pain;Decreased  activity tolerance;Decreased knowledge of use of DME or AE   OT Treatment/Interventions: Self-care/ADL training;DME and/or AE instruction;Therapeutic activities;Patient/family education    OT Goals(Current goals can be found in the care plan section) Acute Rehab OT Goals Patient Stated Goal: go home OT Goal Formulation: With patient/family Time For Goal Achievement: 06/23/16 Potential to Achieve Goals: Good ADL Goals Pt Will Perform Grooming: with supervision;with set-up;with caregiver independent in assisting;standing Pt Will Perform Lower Body Bathing: sitting/lateral leans;sit to/from stand;with caregiver independent in assisting;with min assist;with min guard assist;with adaptive equipment Pt Will Perform Lower Body Dressing: with min guard assist;with min assist;with caregiver independent in assisting;sitting/lateral leans;sit to/from stand Pt Will Transfer to Toilet: with modified independence;ambulating Pt Will Perform Toileting - Clothing Manipulation and hygiene: with min guard assist;sit to/from stand;with caregiver independent in assisting Pt Will Perform Tub/Shower Transfer: with supervision;with modified independence;shower seat;3 in 1;with caregiver independent in assisting;rolling walker;ambulating  OT Frequency: Min 2X/week   Barriers to D/C:    no barriers                     End of Session Equipment Utilized During Treatment: Gait belt  Activity Tolerance: Patient tolerated treatment well Patient left: in chair;with family/visitor present;with call bell/phone within reach   Time: 1354-1423 OT Time Calculation (min): 29 min Charges:  OT General Charges $OT Visit: 1 Procedure OT Evaluation $OT Eval Moderate Complexity: 1 Procedure OT Treatments $Therapeutic Activity: 8-22 mins G-Codes:    Britt Bottom 06/16/2016, 2:38 PM

## 2016-06-16 NOTE — Anesthesia Postprocedure Evaluation (Signed)
Anesthesia Post Note  Patient: Jon LundborgRandy O Wong  Procedure(s) Performed: Procedure(s) (LRB): CERVICAL THREE- FOUR CERVICAL FOUR- FIVE CERVICAL FIVE-SIX CERVICAL SIX- SEVEN POSTERIOR CERVICAL FUSION WITH LATERAL MASS FIXATION (N/A)  Patient location during evaluation: PACU Anesthesia Type: General Level of consciousness: sedated, patient cooperative and oriented Pain control: pain improving. Vital Signs Assessment: post-procedure vital signs reviewed and stable Respiratory status: spontaneous breathing, nonlabored ventilation, respiratory function stable and patient connected to nasal cannula oxygen Cardiovascular status: blood pressure returned to baseline and stable Postop Assessment: no signs of nausea or vomiting Anesthetic complications: no    Last Vitals:  Vitals:   06/16/16 0800 06/16/16 1300  BP:  102/63  Pulse:  88  Resp: 15 16  Temp:  36.7 C    Last Pain:  Vitals:   06/16/16 1300  TempSrc: Oral  PainSc:                  Arzella Rehmann,E. Chrystopher Stangl

## 2016-06-17 MED ORDER — NALOXONE HCL 0.4 MG/ML IJ SOLN
INTRAMUSCULAR | Status: AC
  Filled 2016-06-17: qty 1

## 2016-06-17 NOTE — Progress Notes (Signed)
Occupational Therapy Treatment Patient Details Name: Jon Wong MRN: [id number] DOB: 21-Dec-1960 Today's Date: 06/17/2016    History of present illness 55 y/o male s/p posterior cervical decompression with fusion (C3-7)   OT comments  Pt making progress with functional goals. OT will continue to follow acutely  Follow Up Recommendations  No OT follow up;Supervision - Intermittent    Equipment Recommendations  Other (comment) (ADL A/E kit)    Recommendations for Other Services      Precautions / Restrictions Precautions Precautions: Cervical Precaution Comments: reviewed cervical precatuons Required Braces or Orthoses: Cervical Brace Cervical Brace: Hard collar Restrictions Weight Bearing Restrictions: No       Mobility Bed Mobility Overal bed mobility: Needs Assistance Bed Mobility: Rolling;Sidelying to Sit   Sidelying to sit: Min assist       General bed mobility comments: cues for correct technique  Transfers Overall transfer level: Modified independent Equipment used: None             General transfer comment: reports no dizziness    Balance Overall balance assessment: No apparent balance deficits (not formally assessed)                                 ADL       Grooming: Wash/dry hands;Wash/dry face;Standing;Set up;Supervision/safety       Lower Body Bathing: Minimal assistance;Moderate assistance Lower Body Bathing Details (indicate cue type and reason): simulated         Toilet Transfer: Modified Independent;Supervision/safety;Comfort height toilet;Grab bars;RW;Ambulation   Toileting- Clothing Manipulation and Hygiene: Supervision/safety;Sit to/from stand       Functional mobility during ADLs: Supervision/safety;Rolling walker;Modified independent General ADL Comments: provided demo of ADL A/E for home use with pt and his wife                                      Cognition   Behavior During Therapy:  WFL for tasks assessed/performed Overall Cognitive Status: Within Functional Limits for tasks assessed                       Extremity/Trunk Assessment   WFL                        General Comments  pt very pleasant and cooperative    Pertinent Vitals/ Pain       Pain Assessment: 0-10 Pain Score: 5  Pain Location: neck Pain Descriptors / Indicators: Aching;Sore Pain Intervention(s): Limited activity within patient's tolerance;Monitored during session;Repositioned                                                          Frequency  Min 2X/week        Progress Toward Goals  OT Goals(current goals can now be found in the care plan section)  Progress towards OT goals: Progressing toward goals  Acute Rehab OT Goals Patient Stated Goal: go home OT Goal Formulation: With patient/family  Plan Discharge plan remains appropriate                     End of Session Equipment  Utilized During Treatment: Gait belt   Activity Tolerance Patient tolerated treatment well   Patient Left in chair;with family/visitor present;with call bell/phone within reach             Time: 1141-1204 OT Time Calculation (min): 23 min  Charges: OT General Charges $OT Visit: 1 Procedure OT Treatments $Self Care/Home Management : 8-22 mins $Therapeutic Activity: 8-22 mins  Britt Bottom 06/17/2016, 1:55 PM

## 2016-06-17 NOTE — Progress Notes (Signed)
Spoke to Dr. Bevely Palmeritty regarding pt temp of 101. No new orders given . md aware , pt given tylenol

## 2016-06-17 NOTE — Progress Notes (Signed)
Patient ID: Deborra MedinaRandy O Wong, male   DOB: 10/19/1960, 55 y.o.   MRN: [id number] No arm pain. hemovac removed. Wants a soft collar. will stop pca

## 2016-06-17 NOTE — Progress Notes (Signed)
Patient found with blood on sheets and back.  Further examination showed hemovac tubing is broken.  Dr. Jeral FruitBotero called.  MD stated to remove remaining tubing from patient.  Patient given a bath and remaining tubing removed and area dressed with additional dressing.  No change in patient status or pain level.

## 2016-06-17 NOTE — Progress Notes (Signed)
Orthopedic Tech Progress Note Patient Details:  Deborra MedinaRandy O Kooy 08/04/1960 [id number] Patient has soft collar Patient ID: Deborra MedinaRandy O Hollander, male   DOB: 10/17/1960, 55 y.o.   MRN: [id number]   Jennye MoccasinHughes, Bettymae Yott Craig 06/17/2016, 6:58 PM

## 2016-06-17 NOTE — Care Management (Signed)
Patient has no home health or DME needs at this time.

## 2016-06-17 NOTE — Progress Notes (Signed)
Stopped by to visit with pt, but neither he nor his family member was available. Will try again another time.   06/17/16 1600  Clinical Encounter Type  Visited With Patient not available;Other (Comment) (pt seemed asleep & famliy member present was on phone )  Visit Type Initial   Ephraim Hamburgerynthia A Clarnce Homan, [address]

## 2016-06-17 NOTE — Progress Notes (Signed)
Physical Therapy Treatment Patient Details Name: Jon MedinaRandy O Wong MRN: [id number] DOB: 11/26/1960 Today's Date: 06/17/2016    History of Present Illness 55 y/o male s/p posterior cervical decompression with fusion (C3-7)    PT Comments    Pt continues to progress toward mobility goals. Tolerated stair training well this session. Pt needs HEP next session. Continue to progress as tolerated.    Follow Up Recommendations  No PT follow up;Supervision - Intermittent     Equipment Recommendations  Rolling walker with 5" wheels;3in1 (PT)    Recommendations for Other Services       Precautions / Restrictions Precautions Precautions: Cervical Precaution Comments: Issued illustrated hand out Required Braces or Orthoses: Cervical Brace Cervical Brace: Hard collar Restrictions Weight Bearing Restrictions: No    Mobility  Bed Mobility Overal bed mobility: Needs Assistance Bed Mobility: Rolling;Sidelying to Sit;Sit to Sidelying Rolling: Supervision Sidelying to sit: Min assist     Sit to sidelying: Min guard General bed mobility comments: cues for sequencing and technique; pt did demo carry over from previous session  Transfers Overall transfer level: Modified independent Equipment used: None             General transfer comment: no unsteadiness noted once in standing  Ambulation/Gait Ambulation/Gait assistance: Supervision Ambulation Distance (Feet): 100 Feet (initial 8010ft without AD; much more steady with RW) Assistive device: Rolling walker (2 wheeled) Gait Pattern/deviations: Step-through pattern Gait velocity: decreased   General Gait Details: cues for increased stride length    Stairs Stairs: Yes Stairs assistance: Min guard Stair Management: Two rails;Forwards;Step to pattern;One rail Left;Sideways Number of Stairs:  (2 steps X3) General stair comments: cues for step to pattern and technique; 2 steps ascending forward with bilat rails and then sideways with  L hand rail; pt reported much less painful to go sideways; min guard for safety  Wheelchair Mobility    Modified Rankin (Stroke Patients Only)       Balance Overall balance assessment: No apparent balance deficits (not formally assessed)                                  Cognition Arousal/Alertness: Awake/alert Behavior During Therapy: WFL for tasks assessed/performed Overall Cognitive Status: Within Functional Limits for tasks assessed                      Exercises      General Comments        Pertinent Vitals/Pain Pain Assessment: 0-10 Pain Score: 5  Pain Location: neck Pain Descriptors / Indicators: Aching;Guarding Pain Intervention(s): Limited activity within patient's tolerance;Monitored during session;Premedicated before session;Repositioned    Home Living                      Prior Function            PT Goals (current goals can now be found in the care plan section) Acute Rehab PT Goals Patient Stated Goal: go home Progress towards PT goals: Progressing toward goals    Frequency    Min 5X/week      PT Plan Current plan remains appropriate    Co-evaluation             End of Session Equipment Utilized During Treatment: Cervical collar;Gait belt Activity Tolerance: Patient tolerated treatment well Patient left: in bed;with call bell/phone within reach;with family/visitor present     Time: 5784-69621551-1618 PT Time Calculation (  min) (ACUTE ONLY): 27 min  Charges:  $Gait Training: 8-22 mins $Therapeutic Activity: 8-22 mins                    G Codes:      Jon Wong, PTA Pager: [phone]   06/17/2016, 4:33 PM

## 2016-06-17 NOTE — Op Note (Signed)
NAMShawna Clamp:  Jon Wong, Jon Wong               ACCOUNT NO.:  [id number]  MEDICAL RECORD NO.:  [id number]  LOCATION:                                 FACILITY:  PHYSICIAN:  Hilda LiasErnesto Lorean Ekstrand, M.D.   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  PROCEDURE:  Posterior cervical 3, 4, 5, 6 and 7 laminectomy; bilateral C3-4, 4-5, 5-6, 6-7 foraminotomy.  Lateral mass screws, cervical 3 to C7 bilaterally.  Posterolateral arthrodesis with autograft and BMP.  SURGEON:  Hilda LiasErnesto Ketsia Linebaugh, M.D.  ASSISTANT:  Dr. Marletta Lorarver.  CLINICAL HISTORY:  Mr. Jon Wong is a gentleman, who in the past underwent anterior cervical diskectomy.  Nevertheless, for several months, he had been complaining of worsen of the pain.  He feels that every time he hyperextended his neck, he developed pain and tingling sensation in both upper extremities.  X-ray showed that he has a cervical spondylosis for C3 to C7 with pseudoarthrosis.  The patient wants to proceed with surgery.  Instead of doing an anterior decompression, we decided to proceed with a posterior decompression.  He and his wife knew the risks and benefits of surgery.  DESCRIPTION OF PROCEDURE:  The patient was taken to the OR and after intubation, 3 pins head holder were applied to the head.  From there on, he was set up in the OR table in a supine position.  The shoulder was taped down and the neck was cleaned with DuraPrep.  Drapes were applied. Midline incision from cervical 2 down to cervical 6-7, T1 was made in the midline with retraction of the muscle laterally.  The retraction was done all the  way until we were able to see the lateral aspect of the facet from C3 to C7.  Then, we proceeded with removal of the spinous process of 3, 4, 5, 6 and 7 followed by laminectomies.  Then, using the 1, 2, and 3-mm Kerrison punch, we did a bilateral foraminotomy to decompress the foramen of C3-4, C4-5, C5-6, and C6-7 bilaterally. Having done this, using the C-arm, we used the fluoroscopy in the lateral position.  Nevertheless, because of the shoulder, we were able only to visualize the lateral mass of C3 and C4.  Then, using a standard landmark, we made some holes in the lateral facet from C3 down to C7.  A total of 7 screws of 12 mm were inserted.  In the right side, we had to use an added screw, which went into the pedicle about 18 mm.  We tried to visualize AP and lateral, but it was difficult to see.  Nevertheless, with micro hook, since we have a laminectomy, we were able to follow the lateral mass of and pedicle screws at all levels and there was no any violation of the foramen.  Then, two rods from C3 to C7 were inserted and were secured  in place with Capps.  Then, with the drill, we removed the periosteum of C3-4, 4-5, 5-6, and 6-7 as well as part of the soft tissue of the facet joint.  A mix of BMP and autograft were used for arthrodesis.  A drain was left in the operative site and vancomycin powder was left also in the operative site.  From then on, the wound was closed with different layer of Vicryl and the skin with staples.  After that, the patient was positioned in a prone manner.  The head clamp was removed and he was able to move all the 4 extremities.    ______________________________ Hilda LiasErnesto Jahnay Lantier, M.D.   ______________________________ Hilda LiasErnesto Hoyle Barkdull, M.D.    EB/MEDQ  D:  06/15/2016  T:  06/16/2016  Job:  [id number]

## 2016-06-18 LAB — BASIC METABOLIC PANEL
ANION GAP: 7 (ref 5–15)
BUN: 5 mg/dL — ABNORMAL LOW (ref 6–20)
CALCIUM: 9.1 mg/dL (ref 8.9–10.3)
CO2: 27 mmol/L (ref 22–32)
CREATININE: 1.02 mg/dL (ref 0.61–1.24)
Chloride: 103 mmol/L (ref 101–111)
Glucose, Bld: 174 mg/dL — ABNORMAL HIGH (ref 65–99)
Potassium: 3.8 mmol/L (ref 3.5–5.1)
SODIUM: 137 mmol/L (ref 135–145)

## 2016-06-18 LAB — GLUCOSE, CAPILLARY: GLUCOSE-CAPILLARY: 151 mg/dL — AB (ref 65–99)

## 2016-06-18 MED ORDER — CYCLOBENZAPRINE HCL 10 MG PO TABS: 10.0000 mg | ORAL_TABLET | Freq: Three times a day (TID) | ORAL | 2 refills | Status: DC | PRN

## 2016-06-18 MED ORDER — OXYCODONE-ACETAMINOPHEN 7.5-325 MG PO TABS: 1.0000 | ORAL_TABLET | ORAL | 0 refills | Status: DC | PRN

## 2016-06-18 NOTE — Progress Notes (Signed)
Pharmacy Antibiotic Note  Jon MedinaRandy O Wong is a 55 y.o. male admitted on 06/15/2016.  Pharmacy consulted to manage vancomycin s/p C3-C7 spinal fusion in setting of a drain placement.  Hemovac now removed.  Patient's renal function remains stable.  He is afebrile today and his WBC is WNL.   Plan: - Continue vanc 1gm IV Q12H - Monitor renal fxn, clinical progress, vanc trough soon if still on therapy   Height: 5\' 9"  (175.3 cm) Weight: 183 lb (83 kg) IBW/kg (Calculated) : 70.7  Temp (24hrs), Avg:100.2 F (37.9 C), Min:99.4 F (37.4 C), Max:101.6 F (38.7 C)   Recent Labs Lab 06/15/16 1049 06/18/16 0559  WBC 5.9  --   CREATININE 1.11 1.02    Estimated Creatinine Clearance: 83.8 mL/min (by C-G formula based on SCr of 1.02 mg/dL).    Allergies  Allergen Reactions  . Amoxicillin Itching and Swelling    Has patient had a PCN reaction causing immediate rash, facial/tongue/throat swelling, SOB or lightheadedness with hypotension:No Has patient had a PCN reaction causing severe rash involving mucus membranes or skin necrosis:No Has patient had a PCN reaction that required hospitalization:No Has patient had a PCN reaction occurring within the last 10 years:Yes If all of the above answers are "NO", then may proceed with Cephalosporin use.    Marland Kitchen. Hydrocodone Itching    Patient tolerates in small doses.     Antimicrobials this admission:  Vanc 11/29 >>  Dose adjustments this admission:  N/A  Microbiology results:  11/29 MRSA/SA PCR - negative   Jon Wong D. Laney Potashang, PharmD, BCPS Pager:  [phone] 06/18/2016, 7:37 AM

## 2016-06-18 NOTE — Progress Notes (Signed)
No acute events Moving all extremities well Bandage c/d/i D/c

## 2016-06-18 NOTE — Progress Notes (Signed)
Physical Therapy Treatment Patient Details Name: Jon MedinaRandy O Wong MRN: [id number] DOB: 12/21/1960 Today's Date: 06/18/2016    History of Present Illness 55 y/o male s/p posterior cervical decompression with fusion (C3-7)    PT Comments    Pt making good progress with goals.  Increased gt distance.  Completed stairs again today with client demonstrating safe technique.  Patient safe to D/C from a mobility standpoint based on progression towards goals set on PT eval.    Follow Up Recommendations  No PT follow up;Supervision - Intermittent     Equipment Recommendations  Rolling walker with 5" wheels;3in1 (PT)    Recommendations for Other Services       Precautions / Restrictions Precautions Precautions: Cervical Precaution Comments: Issued illustrated hand out Required Braces or Orthoses: Cervical Brace Cervical Brace: Soft collar Restrictions Weight Bearing Restrictions: No    Mobility  Bed Mobility Overal bed mobility: Needs Assistance Bed Mobility: Rolling;Sidelying to Sit Rolling: Modified independent (Device/Increase time) Sidelying to sit: Min assist       General bed mobility comments: assist to lift shoulders/trunk to sitting upright.    Transfers Overall transfer level: Modified independent Equipment used: Rolling walker (2 wheeled)                Ambulation/Gait Ambulation/Gait assistance: Supervision Ambulation Distance (Feet): 200 Feet Assistive device: Rolling walker (2 wheeled) Gait Pattern/deviations: Step-through pattern;Decreased stride length Gait velocity: decreased   General Gait Details: cues to relax UE's and increase stride length.     Stairs Stairs: Yes Stairs assistance: Supervision Stair Management: One rail Left;Sideways;Forwards Number of Stairs: 3 General stair comments: completed with supervision. no cues needed.  sideways technique for ascending steps and forwards technique with descending.    Wheelchair Mobility     Modified Rankin (Stroke Patients Only)       Balance                                    Cognition Arousal/Alertness: Awake/alert Behavior During Therapy: WFL for tasks assessed/performed Overall Cognitive Status: Within Functional Limits for tasks assessed                      Exercises      General Comments        Pertinent Vitals/Pain Pain Assessment: 0-10 Pain Score: 4  Pain Location: neck Pain Descriptors / Indicators: Aching Pain Intervention(s): Limited activity within patient's tolerance;Monitored during session;Premedicated before session;Repositioned    Home Living                      Prior Function            PT Goals (current goals can now be found in the care plan section) Acute Rehab PT Goals Patient Stated Goal: go home PT Goal Formulation: With patient Time For Goal Achievement: 06/23/16 Potential to Achieve Goals: Good Progress towards PT goals: Progressing toward goals    Frequency    Min 5X/week      PT Plan Current plan remains appropriate    Co-evaluation             End of Session Equipment Utilized During Treatment: Cervical collar Activity Tolerance: Patient tolerated treatment well Patient left: in chair;with call bell/phone within reach;with family/visitor present     Time: 7829-56210900-0924 PT Time Calculation (min) (ACUTE ONLY): 24 min  Charges:  $Gait Training: 8-22 mins $Therapeutic  Activity: 8-22 mins                    G Codes:      Jon MulchCooper, Jon Wong 06/18/2016, 9:30 AM   Jon FaceKelly Corrinna Wong, PTA [phone] 06/18/2016

## 2016-06-18 NOTE — Progress Notes (Addendum)
pca pump stopped at 0245- charge nurse made aware  19 ml wasted of morphine with Erin HearingBrandon Rn . Pt advised to call  Nurse for pain management . He verbalized understanding.

## 2016-06-18 NOTE — Care Management Note (Signed)
Case Management Note  Patient Details  Name: Jon MedinaRandy O Wong MRN: [id number] Date of Birth: 03/22/1961  Subjective/Objective:                  s/p posterior cervical decompression with fusion (C3-7) Action/Plan: Discharge planning Expected Discharge Date:  06/18/16               Expected Discharge Plan:  Home/Self Care  In-House Referral:     Discharge planning Services  CM Consult  Post Acute Care Choice:  Durable Medical Equipment Choice offered to:  NA  DME Arranged:  3-N-1, Walker rolling DME Agency:  [facility]  HH Arranged:  NA HH Agency:  NA  Status of Service:  Completed, signed off  If discussed at Long Length of Stay Meetings, dates discussed:    Additional Comments: CM received call from RN to please arrange for DMe.  CM notified [facility] DME rep, to please deliver the rolling walker and 3n1 to room prior to discharge.  No other CM needs were communicated. Jon DillJeffries, Jon Coyle [name], RN 06/18/2016, 2:43 PM

## 2016-06-18 NOTE — Discharge Summary (Signed)
Date of Admission: 06/15/2016  Date of Discharge: 06/18/16  PREOPERATIVE DIAGNOSIS:  Cervical spondylosis with chronic radiculopathy, C3 down to C6-C7.  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  PROCEDURE:  Posterior cervical 3, 4, 5, 6 and 7 laminectomy; bilateral C3-4, 4-5, 5-6, 6-7 foraminotomy.  Lateral mass screws, cervical 3 to C7 bilaterally.  Posterolateral arthrodesis with autograft and BMP.  Attending: Hilda LiasErnesto Botero, MD  Hospital Course:  The patient was admitted for the above listed operation and had an uncomplicated post-operative course.  They were discharged in stable condition.  Follow up: 3 weeks    Medication List    TAKE these medications   ALPRAZolam 1 MG tablet Commonly known as:  XANAX Take 1 mg by mouth 3 (three) times daily.   celecoxib 200 MG capsule Commonly known as:  CELEBREX Take 400 mg by mouth every evening.   citalopram 20 MG tablet Commonly known as:  CELEXA Take 20 mg by mouth every evening.   cyclobenzaprine 10 MG tablet Commonly known as:  FLEXERIL Take 1 tablet (10 mg total) by mouth 3 (three) times daily as needed for muscle spasms.   DEXILANT 60 MG capsule Generic drug:  dexlansoprazole Take 60 mg by mouth daily.   fenofibrate micronized 134 MG capsule Commonly known as:  LOFIBRA Take 134 mg by mouth every evening.   Fish Oil 1000 MG Caps Take 1,000 mg by mouth every evening.   Magnesium 250 MG Tabs Take 250 mg by mouth every evening.   Melatonin 10 MG Subl Place 20 mg under the tongue at bedtime.   metFORMIN 500 MG tablet Commonly known as:  GLUCOPHAGE Take 500 mg by mouth 2 (two) times daily.   oxyCODONE-acetaminophen 7.5-325 MG tablet Commonly known as:  PERCOCET Take 1-2 tablets by mouth every 4 (four) hours as needed for severe pain.    ramipril 10 MG capsule Commonly known as:  ALTACE Take 10 mg by mouth daily.   simvastatin 40 MG tablet Commonly known as:  ZOCOR Take 40 mg by mouth daily.   Sweet Oil Oil Place 2 drops into both ears every 6 (six) months. 2 drops in each ear (total 4 drops)   Vitamin D-3 5000 units Tabs Take 5,000 Units by mouth daily.

## 2016-06-21 ENCOUNTER — Encounter (HOSPITAL_COMMUNITY): Payer: Self-pay | Admitting: Neurosurgery

## 2016-08-08 ENCOUNTER — Ambulatory Visit: Payer: BLUE CROSS/BLUE SHIELD | Attending: Neurosurgery | Admitting: Physical Therapy

## 2016-08-08 DIAGNOSIS — M542 Cervicalgia: Secondary | ICD-10-CM | POA: Insufficient documentation

## 2016-08-08 DIAGNOSIS — R293 Abnormal posture: Secondary | ICD-10-CM | POA: Insufficient documentation

## 2016-08-08 NOTE — Patient Instructions (Signed)
Provided patient with yellow theraputty for right gripping exercise.

## 2016-08-08 NOTE — Therapy (Signed)
[facility] [address] [place], [place], [address] Phone: [phone]   Fax:  [phone]  Physical Therapy Evaluation  Patient Details  Name: Jon Wong MRN: [id number] Date of Birth: May 25, 1961 Referring Provider: Coletta Memos MD.  Encounter Date: 08/08/2016      PT End of Session - 08/08/16 1231    Visit Number 1   Number of Visits 8   Date for PT Re-Evaluation 09/19/16   PT Start Time 0945   PT Stop Time 1034   PT Time Calculation (min) 49 min   Activity Tolerance Patient tolerated treatment well   Behavior During Therapy [facility] for tasks assessed/performed      Past Medical History:  Diagnosis Date  . Afib (HCC)    pt denies this  . Anxiety   . Arthritis    neck  . 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 Constipation   . Diabetes mellitus without complication (HCC)    type 2  . Family history of adverse reaction to anesthesia    mom is allergic to anesthesia per pt  . GERD (gastroesophageal reflux disease)   . Headache    migraines   . History of kidney stones    27 stones  . Hypertension   . Kidney stones   . 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 Pneumonia   . Ruptured disk     Past Surgical History:  Procedure Laterality Date  . BACK SURGERY     cervical spine surgery  . CARPAL TUNNEL RELEASE Right 2010  . COLONOSCOPY    . HAND SURGERY Right    removed a bone from hand  . POSTERIOR CERVICAL FUSION/FORAMINOTOMY N/A 06/15/2016   Procedure: CERVICAL THREE- FOUR CERVICAL FOUR- FIVE CERVICAL FIVE-SIX CERVICAL SIX- SEVEN POSTERIOR CERVICAL FUSION WITH LATERAL MASS FIXATION;  Surgeon: Hilda Lias, MD;  [facility];  Service: Neurosurgery;  Laterality: N/A;  C3-4 C4-5 C5-6 C6-7 POSTERIOR CERVICAL FUSION WITH LATERAL MASS FIXATION    There were no vitals filed for this visit.       Subjective Assessment - 08/08/16 1248    Pertinent History 3 cervical surgeries.            [facility]  PT Assessment - 08/08/16 0001      Assessment   Medical Diagnosis Radiculopathy, cervical region.   Referring Provider Coletta Memos MD.   Onset Date/Surgical Date --  06/15/17 (surgery date).   Hand Dominance Right     Precautions   Precautions --  Cervical fusion.     Restrictions   Weight Bearing Restrictions No     Balance Screen   Has the patient fallen in the past 6 months No   Has the patient had a decrease in activity level because of a fear of falling?  Yes   Is the patient reluctant to leave their home because of a fear of falling?  No     Home Tourist information centre manager residence     Prior Function   Level of Independence Independent     Posture/Postural Control   Posture/Postural Control Postural limitations   Posture Comments Forward head 1 1/2 inches of rounded shoulders.     ROM / Strength   AROM / PROM / Strength AROM;Strength     AROM   Overall AROM Comments Rigth active cervical rotation= 25 degrees and left= 45 degrees.     Strength   Overall  Strength Comments Rigth shoulder IR/ER= 4-/5; right elbow flexion= 4/5 and right elbow extendion= 4-/5.  Right grip= 10# and left grip= 65#.     Palpation   Palpation comment Very tender to palpation over bilateral suboccipital region.     Special Tests    Special Tests --  Right Brachioradialis DTR= 1+/4+.     Transfers   Transfers --  Independent.     Ambulation/Gait   Gait Comments WNL.                   [facility] Adult PT Treatment/Exercise - 08/08/16 0001      Modalities   Modalities Electrical Stimulation;Moist Heat     Moist Heat Therapy   Number Minutes Moist Heat --  20 minutes.   Moist Heat Location --  Neck.     Emergency planning/management officerlectrical Stimulation   Electrical Stimulation Location Neck.   Electrical Stimulation Action IFC   Electrical Stimulation Parameters 80-150 Hz on 100% scan x 20 minutes.   Electrical Stimulation Goals Pain                             Plan - 08/08/16 1240    Clinical Impression Statement The patient presents with bilateral upper cervical pain and headaches.  He has a significant loss of active cervical range of motion as expect.  He is weak throughtout his right UE musculature and the 4th and 5th fingers of the patient's right hand is very numb.  This is the patient's thired cervical surgery.   Rehab Potential Fair   PT Frequency 2x / week   PT Duration 4 weeks   PT Treatment/Interventions ADLs/Self Care Home Management;Electrical Stimulation;Moist Heat;Ultrasound;Patient/family education;Therapeutic exercise;Therapeutic activities;Manual techniques   PT Next Visit Plan Please establish a HEP:  Right RW4; right bicep curls; standing tricep extension with theraband with right elbow by side.  In supine STW/M to bilateral suboccipital musculature.  Patient can be placed on hold and my continue if he so desires.      Patient will benefit from skilled therapeutic intervention in order to improve the following deficits and impairments:  Pain, Decreased range of motion, Postural dysfunction, Decreased activity tolerance  Visit Diagnosis: Cervicalgia - Plan: PT plan of care cert/re-cert  Abnormal posture - Plan: PT plan of care cert/re-cert     Problem List Patient Active Problem List   Diagnosis Date Noted  . Cervical spondylosis with radiculopathy 06/15/2016    Asberry Lascola, Jon Wong 08/08/2016, 12:57 PM  [facility] [address] [place], [place], [address] Phone: [phone]   Fax:  [phone]  Name: Jon Wong MRN: [id number] Date of Birth: 07/20/1960

## 2016-08-11 ENCOUNTER — Encounter: Payer: Self-pay | Admitting: Physical Therapy

## 2016-08-11 ENCOUNTER — Ambulatory Visit: Payer: BLUE CROSS/BLUE SHIELD | Admitting: Physical Therapy

## 2016-08-11 DIAGNOSIS — R293 Abnormal posture: Secondary | ICD-10-CM

## 2016-08-11 DIAGNOSIS — M542 Cervicalgia: Secondary | ICD-10-CM | POA: Diagnosis not present

## 2016-08-11 NOTE — Patient Instructions (Signed)
  Strengthening: Resisted Flexion   Hold tubing with left arm at side. Pull forward and up. Move shoulder through pain-free range of motion. Repeat __10__ times per set. Do _2-3___ sets per session. Do _2-3___ sessions per day.  Strengthening: Resisted Extension   Hold tubing in right hand, arm forward. Pull arm back, elbow straight. Repeat __10__ times per set. Do _2-3___ sets per session. Do _2-3___ sessions per day.   Strengthening: Resisted Internal Rotation   Hold tubing in left hand, elbow at side and forearm out. Rotate forearm in across body. Repeat __10__ times per set. Do _2-3___ sets per session. Do _2-3___ sessions per day.  Strengthening: Resisted External Rotation   Hold tubing in right hand, elbow at side and forearm across body. Rotate forearm out. Repeat __10__ times per set. Do __2-3__ sets per session. Do ____ sessions per day.   ELASTIC BAND BICEPS CURLS  With your arm at your side holding an elastic band, draw up your hand by bending at the elbow.   Keep your palm face up the entire time. 3x10 2-3 times day    ELASTIC BAND TRICEPS - SELF FIXATION  Start by holding an elastic band across your chest with the unaffected arm.   Next, pull the band downward with the other arm so that the elbow goes from a bent position to a straightened position as shown. AROM: Neck Rotation   Turn head slowly to look over one shoulder, then the other. Hold each position _10___ seconds. Repeat _5___ times per set. Do __2__ sets per session. Do _2-3___ sessions per day.  http://orth.exer.us/294   Copyright  VHI. All rights reserved.  AROM: Lateral Neck Flexion   Slowly tilt head toward one shoulder, then the other. Hold each position _10___ seconds. Repeat __5__ times per set. Do __2__ sets per session. Do __2-3__ sessions per day.  http://orth.exer.us/296   Copyright  VHI. All rights reserved.  Stretch Break - Chin Tuck   Looking straight forward, tuck  chin and hold __10__ seconds. Relax and return to starting position. Repeat __5-10__ times every _3-4___ hours.  Copyright  VHI. All rights reserved.  Stretch Break - Chest and Shoulder Stretch   Maintaining erect posture, draw shoulders back while bringing elbows back and inward. Return to starting position. Repeat __10-20__ times every _3-4___ hours.  Copyright  VHI. All rights reserved.

## 2016-08-11 NOTE — Therapy (Signed)
[facility] [place], [place], [address] Phone: [phone]   Fax:  [phone]  Physical Therapy Treatment  Patient Details  Name: Jon Wong MRN: [id number] Date of Birth: Sep 19, 1960 Referring Provider: Ashok Pall MD.  Encounter Date: 08/11/2016      PT End of Session - 08/11/16 1027    Visit Number 2   Number of Visits 8   Date for PT Re-Evaluation 09/19/16   PT Start Time 0950   PT Stop Time 1046   PT Time Calculation (min) 56 min   Activity Tolerance Patient tolerated treatment well   Behavior During Therapy [facility] for tasks assessed/performed      Past Medical History:  Diagnosis Date  . Afib ([place])    pt denies this  . Anxiety   . Arthritis    neck  . 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 Constipation   . Diabetes mellitus without complication ([place])    type 2  . Family history of adverse reaction to anesthesia    mom is allergic to anesthesia per pt  . GERD (gastroesophageal reflux disease)   . Headache    migraines   . History of kidney stones    27 stones  . Hypertension   . Kidney stones   . Neuropathy ([place])    in feet  . Pneumonia   . Ruptured disk     Past Surgical History:  Procedure Laterality Date  . BACK SURGERY     cervical spine surgery  . CARPAL TUNNEL RELEASE Right 2010  . COLONOSCOPY    . HAND SURGERY Right    removed a bone from hand  . POSTERIOR CERVICAL FUSION/FORAMINOTOMY N/A 06/15/2016   Procedure: CERVICAL THREE- FOUR CERVICAL FOUR- FIVE CERVICAL FIVE-SIX CERVICAL SIX- SEVEN POSTERIOR CERVICAL FUSION WITH LATERAL MASS FIXATION;  Surgeon: Leeroy Cha, MD;  Location: [place];  Service: Neurosurgery;  Laterality: N/A;  C3-4 C4-5 C5-6 C6-7 POSTERIOR CERVICAL FUSION WITH LATERAL MASS FIXATION    There were no vitals filed for this visit.      Subjective Assessment - 08/11/16 0953    Subjective Patient has increase pain in right c-spine and some  radiating to left   Pertinent History 3 cervical surgeries.   Limitations Walking;Standing   How long can you stand comfortably? One hour.   How long can you walk comfortably? One hour.   Patient Stated Goals Reduce pain and get rid of headaches.   Currently in Pain? Yes   Pain Score 5    Pain Location Neck   Pain Orientation Right;Left  more in right   Pain Descriptors / Indicators Aching;Sore;Numbness   Pain Type Surgical pain   Pain Onset More than a month ago   Pain Frequency Constant   Aggravating Factors  hurts all the time   Pain Relieving Factors medication                         [facility] Adult PT Treatment/Exercise - 08/11/16 0001      Exercises   Exercises Neck;Shoulder     Neck Exercises: Seated   Neck Retraction Other (comment)  2x10   Cervical Rotation Both  2x10   Lateral Flexion Both  2x10   Other Seated Exercise scap retractions 3x10     Shoulder Exercises: Standing   Protraction Strengthening;Right;Theraband  3x10   Theraband Level (Shoulder Protraction) Level 2 (Red)   External  Rotation Strengthening;Right;Theraband  3x10   Theraband Level (Shoulder External Rotation) Level 2 (Red)   Internal Rotation Strengthening;Right;Theraband  3x10   Theraband Level (Shoulder Internal Rotation) Level 2 (Red)   Row Strengthening;Right;Theraband  3x10   Theraband Level (Shoulder Row) Level 2 (Red)   Other Standing Exercises red-tband bicep/tricep 3x10     Moist Heat Therapy   Number Minutes Moist Heat 15 Minutes   Moist Heat Location Cervical     Electrical Stimulation   Electrical Stimulation Location Neck.   Electrical Stimulation Action IFC   Electrical Stimulation Parameters 1-_0  x27mn   Electrical Stimulation Goals Pain     Manual Therapy   Manual Therapy Soft tissue mobilization   Soft tissue mobilization gentle STW to bil c-spine muscles                PT Education - 08/11/16 1016    Education provided Yes    Education Details HEP   Person(s) Educated Patient   Methods Explanation;Demonstration;Handout   Comprehension Verbalized understanding;Returned demonstration             PT Long Term Goals - 08/11/16 1028      PT LONG TERM GOAL #1   Title Independent with a HEP.   Time 4   Period Weeks   Status Achieved               Plan - 08/11/16 1029    Clinical Impression Statement Patient tolerated treatment well. Patient had no increase pain reported. Patient was given HEP for all exercises and had good technique. Patient would like to have one more treatment next week and then may be put on hold. Patient met goal of HEP   Rehab Potential Fair   PT Frequency 2x / week   PT Duration 4 weeks   PT Treatment/Interventions ADLs/Self Care Home Management;Electrical Stimulation;Moist Heat;Ultrasound;Patient/family education;Therapeutic exercise;Therapeutic activities;Manual techniques   PT Next Visit Plan cont with POC to review exercises or STW/Heat/ES per patient tolerance   Consulted and Agree with Plan of Care Patient      Patient will benefit from skilled therapeutic intervention in order to improve the following deficits and impairments:  Pain, Decreased range of motion, Postural dysfunction, Decreased activity tolerance  Visit Diagnosis: Cervicalgia  Abnormal posture     Problem List Patient Active Problem List   Diagnosis Date Noted  . Cervical spondylosis with radiculopathy 06/15/2016    DPhillips Climes PTA 08/11/2016, 10:51 AM  [facility] [address]Phone: [phone]  Fax:  [phone] Name: Jon LEMOINEMRN: [id number] of Birth: 111-Sep-1962

## 2016-08-17 ENCOUNTER — Encounter: Payer: Self-pay | Admitting: Physical Therapy

## 2016-08-17 ENCOUNTER — Ambulatory Visit: Payer: BLUE CROSS/BLUE SHIELD | Admitting: Physical Therapy

## 2016-08-17 DIAGNOSIS — R293 Abnormal posture: Secondary | ICD-10-CM

## 2016-08-17 DIAGNOSIS — M542 Cervicalgia: Secondary | ICD-10-CM

## 2016-08-17 NOTE — Therapy (Signed)
[facility] [address] [place], [place], [address] Phone: [phone]   Fax:  [phone]  Physical Therapy Treatment  Patient Details  Name: Jon MedinaRandy O Wong MRN: [id number] Date of Birth: 03/28/1961 Referring Provider: Coletta MemosKyle Cabbell MD.  Encounter Date: 08/17/2016      PT End of Session - 08/17/16 1031    Visit Number 3   Number of Visits 8   Date for PT Re-Evaluation 09/19/16   PT Start Time 1030   PT Stop Time (P)  1118   PT Time Calculation (min) (P)  48 min   Activity Tolerance Patient tolerated treatment well   Behavior During Therapy [facility] for tasks assessed/performed      Past Medical History:  Diagnosis Date  . Afib (HCC)    pt denies this  . Anxiety   . Arthritis    neck  . 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 Constipation   . Diabetes mellitus without complication (HCC)    type 2  . Family history of adverse reaction to anesthesia    mom is allergic to anesthesia per pt  . GERD (gastroesophageal reflux disease)   . Headache    migraines   . History of kidney stones    27 stones  . Hypertension   . Kidney stones   . 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 Pneumonia   . Ruptured disk     Past Surgical History:  Procedure Laterality Date  . BACK SURGERY     cervical spine surgery  . CARPAL TUNNEL RELEASE Right 2010  . COLONOSCOPY    . HAND SURGERY Right    removed a bone from hand  . POSTERIOR CERVICAL FUSION/FORAMINOTOMY N/A 06/15/2016   Procedure: CERVICAL THREE- FOUR CERVICAL FOUR- FIVE CERVICAL FIVE-SIX CERVICAL SIX- SEVEN POSTERIOR CERVICAL FUSION WITH LATERAL MASS FIXATION;  Surgeon: Hilda LiasErnesto Botero, MD;  [facility];  Service: Neurosurgery;  Laterality: N/A;  C3-4 C4-5 C5-6 C6-7 POSTERIOR CERVICAL FUSION WITH LATERAL MASS FIXATION    There were no vitals filed for this visit.      Subjective Assessment - 08/17/16 1031    Subjective Reports trouble with reaching up and  concerned with R fifth digit numbness.   Pertinent History 3 cervical surgeries.   Limitations Walking;Standing   How long can you stand comfortably? One hour.   How long can you walk comfortably? One hour.   Patient Stated Goals Reduce pain and get rid of headaches.   Currently in Pain? No/denies            [facility] - 08/17/16 0001      Assessment   Medical Diagnosis Radiculopathy, cervical region.   Onset Date/Surgical Date 06/15/17   Hand Dominance Right   Next MD Visit 09/08/2016     Restrictions   Weight Bearing Restrictions No     ROM / Strength   AROM / PROM / Strength Strength     Strength   Strength Assessment Site Hand   Right/Left hand Right;Left   Right Hand Grip (lbs) 45   Left Hand Grip (lbs) 70                     [facility] Adult PT Treatment/Exercise - 08/17/16 0001      Exercises   Exercises Neck;Shoulder     Neck Exercises: Seated   Cervical Rotation Both;10 reps   W Back 20 reps  Neck Exercises: Supine   Neck Retraction 20 reps;3 secs   Upper Extremity D2 Flexion;15 reps     Shoulder Exercises: Supine   Flexion AAROM;Both;20 reps     Shoulder Exercises: Standing   Protraction Strengthening;Right;20 reps;Theraband   Theraband Level (Shoulder Protraction) Level 1 (Yellow)   External Rotation Strengthening;Right;20 reps;Theraband   Theraband Level (Shoulder External Rotation) Level 1 (Yellow)   Internal Rotation Strengthening;Right;20 reps;Theraband   Theraband Level (Shoulder Internal Rotation) Level 1 (Yellow)   Extension Strengthening;Right;20 reps;Theraband   Theraband Level (Shoulder Extension) Level 1 (Yellow)   Row Strengthening;Right;20 reps;Theraband   Theraband Level (Shoulder Row) Level 1 (Yellow)   Other Standing Exercises yellow-tband bicep/tricep 2x10     Shoulder Exercises: ROM/Strengthening   UBE (Upper Arm Bike) 120 RPM x5 min     Modalities   Modalities Electrical Stimulation;Moist Heat      Moist Heat Therapy   Number Minutes Moist Heat 15 Minutes   Moist Heat Location Cervical     Electrical Stimulation   Electrical Stimulation Location B UT   Electrical Stimulation Action IFC   Electrical Stimulation Parameters 1-10 hz x15 min   Electrical Stimulation Goals Pain                     PT Long Term Goals - 08/11/16 1028      PT LONG TERM GOAL #1   Title Independent with a HEP.   Time 4   Period Weeks   Status Achieved               Plan - 08/17/16 1106    Clinical Impression Statement Patient tolerated today's treatment well as he arrived with no cervical pain. Patient guided through cervical and shoulder strengthening exercises as well as postural exercises with patient noting that he "felt it" while completing cervical retraction into pillow and had the most difficulty with W arms. Patient remains most limited with AROM cervical rotation to R side as he states he experiences strain along L side of his neck. Patient educated that if any discomfort was encountered to stop just short of the pain to not exaggerate any pain more than necessary. Patient reports compliance with HEP and modalities that he has at home such as TENS unit and heating pad. Normal modalities response noted following removal of the modalities.   Rehab Potential Fair   PT Frequency 2x / week   PT Duration 4 weeks   PT Treatment/Interventions ADLs/Self Care Home Management;Electrical Stimulation;Moist Heat;Ultrasound;Patient/family education;Therapeutic exercise;Therapeutic activities;Manual techniques   PT Next Visit Plan cont with POC to review exercises or STW/Heat/ES per patient tolerance   Consulted and Agree with Plan of Care Patient      Patient will benefit from skilled therapeutic intervention in order to improve the following deficits and impairments:  Pain, Decreased range of motion, Postural dysfunction, Decreased activity tolerance  Visit  Diagnosis: Cervicalgia  Abnormal posture     Problem List Patient Active Problem List   Diagnosis Date Noted  . Cervical spondylosis with radiculopathy 06/15/2016    Evelene Croon, PTA 08/17/2016, 11:51 AM  [facility] [address] [place], [place], [address] Phone: [phone]   Fax:  [phone]  Name: LON KLIPPEL MRN: [id number] Date of Birth: [birth year]

## 2016-08-23 ENCOUNTER — Ambulatory Visit: Payer: BLUE CROSS/BLUE SHIELD | Attending: Neurosurgery | Admitting: *Deleted

## 2016-08-23 DIAGNOSIS — M542 Cervicalgia: Secondary | ICD-10-CM

## 2016-08-23 DIAGNOSIS — R293 Abnormal posture: Secondary | ICD-10-CM | POA: Diagnosis present

## 2016-08-23 NOTE — Therapy (Signed)
[facility] [address] [place], [place], [address] Phone: [phone]   Fax:  [phone]  Physical Therapy Treatment  Patient Details  Name: Jon MedinaRandy O Hansen MRN: [id number] Date of Birth: 01/23/1961 Referring Provider: Coletta MemosKyle Cabbell MD.  Encounter Date: 08/23/2016      PT End of Session - 08/23/16 1134    Visit Number 4   Number of Visits 8   Date for PT Re-Evaluation 09/19/16   PT Start Time 1030   PT Stop Time 1121   PT Time Calculation (min) 51 min      Past Medical History:  Diagnosis Date  . Afib (HCC)    pt denies this  . Anxiety   . Arthritis    neck  . 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 Constipation   . Diabetes mellitus without complication (HCC)    type 2  . Family history of adverse reaction to anesthesia    mom is allergic to anesthesia per pt  . GERD (gastroesophageal reflux disease)   . Headache    migraines   . History of kidney stones    27 stones  . Hypertension   . Kidney stones   . 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 Pneumonia   . Ruptured disk     Past Surgical History:  Procedure Laterality Date  . BACK SURGERY     cervical spine surgery  . CARPAL TUNNEL RELEASE Right 2010  . COLONOSCOPY    . HAND SURGERY Right    removed a bone from hand  . POSTERIOR CERVICAL FUSION/FORAMINOTOMY N/A 06/15/2016   Procedure: CERVICAL THREE- FOUR CERVICAL FOUR- FIVE CERVICAL FIVE-SIX CERVICAL SIX- SEVEN POSTERIOR CERVICAL FUSION WITH LATERAL MASS FIXATION;  Surgeon: Hilda LiasErnesto Botero, MD;  Location: [facility];  Service: Neurosurgery;  Laterality: N/A;  C3-4 C4-5 C5-6 C6-7 POSTERIOR CERVICAL FUSION WITH LATERAL MASS FIXATION    There were no vitals filed for this visit.      Subjective Assessment - 08/23/16 1034    Subjective Reports trouble with reaching up and concerned with R fifth digit numbness. My endurance is low                         [name]  Treatment/Exercise - 08/23/16 0001      Exercises   Exercises Neck;Shoulder     Neck Exercises: Standing   Upper Extremity Flexion with Stabilization 20 reps  Both UEs     Neck Exercises: Seated   W Back 20 reps     Neck Exercises: Supine   Upper Extremity D2 Flexion;15 reps     Shoulder Exercises: Standing   Protraction Strengthening;Right;20 reps;Theraband   Theraband Level (Shoulder Protraction) Level 1 (Yellow)   External Rotation Strengthening;Right;20 reps;Theraband   Theraband Level (Shoulder External Rotation) Level 1 (Yellow)   Internal Rotation Strengthening;Right;20 reps;Theraband   Theraband Level (Shoulder Internal Rotation) Level 1 (Yellow)   Extension Strengthening;Right;20 reps;Theraband   Theraband Level (Shoulder Extension) Level 1 (Yellow)   Row Strengthening;Right;20 reps;Theraband   Theraband Level (Shoulder Row) Level 1 (Yellow)   Other Standing Exercises --     Shoulder Exercises: ROM/Strengthening   UBE (Upper Arm Bike) 120 RPM x5 min     Modalities   Modalities Electrical Stimulation;Moist Heat     Moist Heat Therapy   Number Minutes Moist Heat 15 Minutes   Moist Heat Location Cervical  Programme researcher, broadcasting/film/video Location B UT   IFC x 15 mins 1-10hz    Electrical Stimulation Goals Pain                     PT Long Term Goals - 08/11/16 1028      PT LONG TERM GOAL #1   Title Independent with a HEP.   Time 4   Period Weeks   Status Achieved               Plan - 08/23/16 1134    Clinical Impression Statement Pt did fairly well with Rx today and was able to complete all posture and UE exs with minimal pain increase. He did have some mm spasms in RT arm toward the end of Rx during D1 and D2  reaches.. He had normal responses to modalities and had no complaints after RX.   Rehab Potential Fair   PT Frequency 2x / week   PT Duration 4 weeks   PT Treatment/Interventions ADLs/Self Care Home  Management;Electrical Stimulation;Moist Heat;Ultrasound;Patient/family education;Therapeutic exercise;Therapeutic activities;Manual techniques   PT Next Visit Plan cont with POC to review exercises or STW/Heat/ES per patient tolerance   Consulted and Agree with Plan of Care Patient      Patient will benefit from skilled therapeutic intervention in order to improve the following deficits and impairments:  Pain, Decreased range of motion, Postural dysfunction, Decreased activity tolerance  Visit Diagnosis: Cervicalgia  Abnormal posture     Problem List Patient Active Problem List   Diagnosis Date Noted  . Cervical spondylosis with radiculopathy 06/15/2016    RAMSEUR,CHRIS, PTA 08/23/2016, 11:47 AM  [facility] [address] [place], [place], [address] Phone: [phone]   Fax:  [phone]  Name: Jon Wong MRN: [id number] Date of Birth: 1961/06/19

## 2016-08-30 ENCOUNTER — Ambulatory Visit: Payer: BLUE CROSS/BLUE SHIELD | Admitting: *Deleted

## 2016-08-30 DIAGNOSIS — R293 Abnormal posture: Secondary | ICD-10-CM

## 2016-08-30 DIAGNOSIS — M542 Cervicalgia: Secondary | ICD-10-CM | POA: Diagnosis not present

## 2016-08-30 NOTE — Therapy (Signed)
[facility] [address] [place], [place], [address] Phone: [phone]   Fax:  [phone]  Physical Therapy Treatment  Patient Details  Name: Jon Wong MRN: [id number] Date of Birth: 08/21/1960 Referring Provider: Coletta MemosKyle Cabbell MD.  Encounter Date: 08/30/2016      PT End of Session - 08/30/16 1125    Visit Number 5   Number of Visits 8   Date for PT Re-Evaluation 09/19/16   PT Start Time 1030   PT Stop Time 1131   PT Time Calculation (min) 61 min      Past Medical History:  Diagnosis Date  . Afib (HCC)    pt denies this  . Anxiety   . Arthritis    neck  . 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 Constipation   . Diabetes mellitus without complication (HCC)    type 2  . Family history of adverse reaction to anesthesia    mom is allergic to anesthesia per pt  . GERD (gastroesophageal reflux disease)   . Headache    migraines   . History of kidney stones    27 stones  . Hypertension   . Kidney stones   . 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 Pneumonia   . Ruptured disk     Past Surgical History:  Procedure Laterality Date  . BACK SURGERY     cervical spine surgery  . CARPAL TUNNEL RELEASE Right 2010  . COLONOSCOPY    . HAND SURGERY Right    removed a bone from hand  . POSTERIOR CERVICAL FUSION/FORAMINOTOMY N/A 06/15/2016   Procedure: CERVICAL THREE- FOUR CERVICAL FOUR- FIVE CERVICAL FIVE-SIX CERVICAL SIX- SEVEN POSTERIOR CERVICAL FUSION WITH LATERAL MASS FIXATION;  Surgeon: Hilda LiasErnesto Botero, MD;  Location: [facility];  Service: Neurosurgery;  Laterality: N/A;  C3-4 C4-5 C5-6 C6-7 POSTERIOR CERVICAL FUSION WITH LATERAL MASS FIXATION    There were no vitals filed for this visit.      Subjective Assessment - 08/30/16 1041    Subjective Reports trouble with reaching up and concerned with R fifth digit numbness. My endurance is still low. I was only able to be up for about 2 hours due to fatigue.   Pertinent History 3 cervical surgeries.   Limitations Walking;Standing   How long can you stand comfortably? One hour.   How long can you walk comfortably? 2 hrs   Currently in Pain? Yes   Pain Score 6    Pain Location Neck   Pain Orientation Left   Pain Descriptors / Indicators Aching   Pain Frequency Constant                         [facility] Adult PT Treatment/Exercise - 08/30/16 0001      Exercises   Exercises Neck;Shoulder     Neck Exercises: Standing   Upper Extremity Flexion with Stabilization 20 reps  Both UEs  2x20   Other Standing Exercises XTS pink Rows 2x20     Neck Exercises: Seated   W Back 20 reps  2x20     Shoulder Exercises: Standing   External Rotation Strengthening;Right;Theraband  3x fatigue   Theraband Level (Shoulder External Rotation) Level 1 (Yellow)   Internal Rotation Strengthening;Right;20 reps;Theraband   Theraband Level (Shoulder Internal Rotation) Level 1 (Yellow)     Shoulder Exercises: Pulleys   Flexion --  x 5 mins     Shoulder Exercises:  ROM/Strengthening   UBE (Upper Arm Bike) 120 RPM x5 min     Modalities   Modalities Electrical Stimulation;Moist Heat     Moist Heat Therapy   Number Minutes Moist Heat 15 Minutes   Moist Heat Location Cervical     Electrical Stimulation   Electrical Stimulation Location B UT   IFC x 15 mins 1-10hz    Electrical Stimulation Goals Pain     Manual Therapy   Manual Therapy Soft tissue mobilization   Soft tissue mobilization gentle STW to bil c-spine muscles                     PT Long Term Goals - 08/11/16 1028      PT LONG TERM GOAL #1   Title Independent with a HEP.   Time 4   Period Weeks   Status Achieved               Plan - 08/30/16 1127    Clinical Impression Statement Pt had higher pain levels when arriving to clinic today 5-6/10 on both sides of his neck. He was able to complete therex for neck and Bil. UEs with minimal pain increase. He had  notable tightness along cervical paras and incision is less mobile from mid to distal area. He tolerated STW and scar mobilization well.Marland Kitchen LTGs are ongoing   Rehab Potential Fair   PT Frequency 2x / week   PT Duration 4 weeks   PT Treatment/Interventions ADLs/Self Care Home Management;Electrical Stimulation;Moist Heat;Ultrasound;Patient/family education;Therapeutic exercise;Therapeutic activities;Manual techniques   PT Next Visit Plan cont with POC to review exercises or STW/Heat/ES per patient tolerance   Consulted and Agree with Plan of Care Patient      Patient will benefit from skilled therapeutic intervention in order to improve the following deficits and impairments:  Pain, Decreased range of motion, Postural dysfunction, Decreased activity tolerance  Visit Diagnosis: Cervicalgia  Abnormal posture     Problem List Patient Active Problem List   Diagnosis Date Noted  . Cervical spondylosis with radiculopathy 06/15/2016    Jon Wong,Jon Wong, Jon Wong 08/30/2016, 12:58 PM  [facility] [address] [place], [place], [address] Phone: [phone]   Fax:  [phone]  Name: Jon Wong MRN: [id number] Date of Birth: 1960/09/16

## 2016-09-15 ENCOUNTER — Ambulatory Visit: Payer: BLUE CROSS/BLUE SHIELD | Attending: Neurosurgery | Admitting: Physical Therapy

## 2016-09-15 ENCOUNTER — Encounter: Payer: Self-pay | Admitting: Physical Therapy

## 2016-09-15 DIAGNOSIS — M542 Cervicalgia: Secondary | ICD-10-CM | POA: Diagnosis present

## 2016-09-15 DIAGNOSIS — R293 Abnormal posture: Secondary | ICD-10-CM | POA: Diagnosis present

## 2016-09-15 NOTE — Therapy (Signed)
[facility] [address] [place], [place], [address] Phone: [phone]   Fax:  [phone]  Physical Therapy Treatment  Patient Details  Name: Deborra MedinaRandy O Mcmorris MRN: [id number] Date of Birth: 10/22/1960 Referring Provider: Coletta MemosKyle Cabbell MD.  Encounter Date: 09/15/2016      PT End of Session - 09/15/16 0954    Visit Number 6   Number of Visits 8   Date for PT Re-Evaluation 09/19/16   PT Start Time 0945   PT Stop Time 1030   PT Time Calculation (min) 45 min   Activity Tolerance Patient tolerated treatment well   Behavior During Therapy [facility] for tasks assessed/performed      Past Medical History:  Diagnosis Date  . Afib (HCC)    pt denies this  . Anxiety   . Arthritis    neck  . 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 Constipation   . Diabetes mellitus without complication (HCC)    type 2  . Family history of adverse reaction to anesthesia    mom is allergic to anesthesia per pt  . GERD (gastroesophageal reflux disease)   . Headache    migraines   . History of kidney stones    27 stones  . Hypertension   . Kidney stones   . 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 Pneumonia   . Ruptured disk     Past Surgical History:  Procedure Laterality Date  . BACK SURGERY     cervical spine surgery  . CARPAL TUNNEL RELEASE Right 2010  . COLONOSCOPY    . HAND SURGERY Right    removed a bone from hand  . POSTERIOR CERVICAL FUSION/FORAMINOTOMY N/A 06/15/2016   Procedure: CERVICAL THREE- FOUR CERVICAL FOUR- FIVE CERVICAL FIVE-SIX CERVICAL SIX- SEVEN POSTERIOR CERVICAL FUSION WITH LATERAL MASS FIXATION;  Surgeon: Hilda LiasErnesto Botero, MD;  Location: [facility] OR;  Service: Neurosurgery;  Laterality: N/A;  C3-4 C4-5 C5-6 C6-7 POSTERIOR CERVICAL FUSION WITH LATERAL MASS FIXATION    There were no vitals filed for this visit.      Subjective Assessment - 09/15/16 0949    Subjective pt arriving today reporting pain of 6/10 in his neck and  upper traps. Pt also reports not sleeping well at night. Pt also reporting decrease in stamina where he feels more fatigue.    Pertinent History 3 cervical surgeries.   Limitations Walking;Standing   How long can you stand comfortably? One hour.   How long can you walk comfortably? 2 hrs   Patient Stated Goals Reduce pain and get rid of headaches.   Currently in Pain? Yes   Pain Score 6    Pain Location Neck   Pain Orientation Left   Pain Descriptors / Indicators Aching   Pain Type Surgical pain   Pain Onset More than a month ago   Pain Frequency Constant   Aggravating Factors  movements, reaching    Pain Relieving Factors medication   Multiple Pain Sites No                         [facility] Adult PT Treatment/Exercise - 09/15/16 0001      Exercises   Exercises Neck;Shoulder     Neck Exercises: Standing   Upper Extremity Flexion with Stabilization 20 reps     Neck Exercises: Seated   W Back 20 reps     Neck Exercises: Supine   Neck Retraction  10 reps;5 secs     Shoulder Exercises: Pulleys   Flexion 3 minutes     Shoulder Exercises: ROM/Strengthening   UBE (Upper Arm Bike) 120 RPM x5 min  2.5 minutes forward and 2.5 minutes backward     Modalities   Modalities Electrical Stimulation;Moist Heat     Moist Heat Therapy   Number Minutes Moist Heat 15 Minutes   Moist Heat Location Cervical     Electrical Stimulation   Electrical Stimulation Location bilateral Upper Trap    Electrical Stimulation Action IFC   Electrical Stimulation Parameters 1-10Hz  x 15 minutes   Electrical Stimulation Goals Pain     Manual Therapy   Manual Therapy Soft tissue mobilization   Soft tissue mobilization cervical paraspinals,occipital musculature, upper traps, levators                PT Education - 09/15/16 0953    Education Details Home exercises reviewed verbally   Person(s) Educated Patient   Methods Explanation   Comprehension Verbalized understanding              PT Long Term Goals - 09/15/16 0954      PT LONG TERM GOAL #1   Title Independent with a HEP.   Time 4   Period Weeks   Status Achieved               Plan - 09/15/16 1033    Clinical Impression Statement Patient arriving to therapy complaining of 6/10 neck pain. Pt tolerated therapy session well and discussed continued posture correction throughout each day. Pt stated he wants to return to playing golf. Pt still with notable tenderness and tighness in upper traps and cervical paraspinals. Continue with skilled PT to address pt's impairments with below interventions.    Rehab Potential Fair   PT Frequency 2x / week   PT Duration 4 weeks   PT Treatment/Interventions ADLs/Self Care Home Management;Electrical Stimulation;Moist Heat;Ultrasound;Patient/family education;Therapeutic exercise;Therapeutic activities;Manual techniques   PT Next Visit Plan cont with POC to review exercises or STW/Heat/ES per patient tolerance   Consulted and Agree with Plan of Care Patient      Patient will benefit from skilled therapeutic intervention in order to improve the following deficits and impairments:  Pain, Decreased range of motion, Postural dysfunction, Decreased activity tolerance  Visit Diagnosis: Cervicalgia  Abnormal posture     Problem List Patient Active Problem List   Diagnosis Date Noted  . Cervical spondylosis with radiculopathy 06/15/2016    Sharmon Leyden, MPT 09/15/2016, 10:37 AM  [facility] [address] [place], [place], [address] Phone: [phone]   Fax:  [phone]  Name: XAVI TOMASIK MRN: [id number] Date of Birth: Sep 05, 1960

## 2016-09-21 ENCOUNTER — Encounter: Payer: Self-pay | Admitting: Physical Therapy

## 2016-09-21 ENCOUNTER — Ambulatory Visit: Payer: BLUE CROSS/BLUE SHIELD | Admitting: Physical Therapy

## 2016-09-21 DIAGNOSIS — M542 Cervicalgia: Secondary | ICD-10-CM

## 2016-09-21 DIAGNOSIS — R293 Abnormal posture: Secondary | ICD-10-CM

## 2016-09-21 NOTE — Therapy (Signed)
[facility] [place], [place], [address] Phone: [phone]   Fax:  [phone]  Physical Therapy Treatment  Patient Details  Name: Jon Wong MRN: [id number] Date of Birth: 09-14-1960 Referring Provider: Ashok Pall MD.  Encounter Date: 09/21/2016      PT End of Session - 09/21/16 1130    Visit Number 7   Number of Visits 8   Date for PT Re-Evaluation 09/19/16   PT Start Time 1115   PT Stop Time 1200   PT Time Calculation (min) 45 min   Activity Tolerance Patient tolerated treatment well   Behavior During Therapy [facility] for tasks assessed/performed      Past Medical History:  Diagnosis Date  . Afib ([place])    pt denies this  . Anxiety   . Arthritis    neck  . Cancer (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 Constipation   . Diabetes mellitus without complication ([place])    type 2  . Family history of adverse reaction to anesthesia    mom is allergic to anesthesia per pt  . GERD (gastroesophageal reflux disease)   . Headache    migraines   . History of kidney stones    27 stones  . Hypertension   . Kidney stones   . Neuropathy ([place])    in feet  . Pneumonia   . Ruptured disk     Past Surgical History:  Procedure Laterality Date  . BACK SURGERY     cervical spine surgery  . CARPAL TUNNEL RELEASE Right 2010  . COLONOSCOPY    . HAND SURGERY Right    removed a bone from hand  . POSTERIOR CERVICAL FUSION/FORAMINOTOMY N/A 06/15/2016   Procedure: CERVICAL THREE- FOUR CERVICAL FOUR- FIVE CERVICAL FIVE-SIX CERVICAL SIX- SEVEN POSTERIOR CERVICAL FUSION WITH LATERAL MASS FIXATION;  Surgeon: Leeroy Cha, MD;  Location: [place];  Service: Neurosurgery;  Laterality: N/A;  C3-4 C4-5 C5-6 C6-7 POSTERIOR CERVICAL FUSION WITH LATERAL MASS FIXATION    There were no vitals filed for this visit.      Subjective Assessment - 09/21/16 1118    Subjective Patient arrived and reported he feels 100% better and no  complaints, he would like today to be his last visit.    Pertinent History 3 cervical surgeries.   Limitations Walking;Standing   How long can you stand comfortably? One hour.   How long can you walk comfortably? 2 hrs   Patient Stated Goals Reduce pain and get rid of headaches.   Currently in Pain? No/denies                         [facility] Adult PT Treatment/Exercise - 09/21/16 0001      Neck Exercises: Standing   Upper Extremity Flexion with Stabilization 20 reps   Other Standing Exercises Green t-band for rows 2x20   Other Standing Exercises chin tuck with bil 2# scaption 2x10     Neck Exercises: Seated   W Back 20 reps     Neck Exercises: Supine   Neck Retraction 10 reps;5 secs     Shoulder Exercises: ROM/Strengthening   UBE (Upper Arm Bike) 120 RPM x6 min     Moist Heat Therapy   Number Minutes Moist Heat 15 Minutes   Moist Heat Location Cervical     Electrical Stimulation   Electrical Stimulation Location bilateral Upper Trap    Electrical Stimulation Action IFC  Electrical Stimulation Parameters 1-10hz x53mn   Electrical Stimulation Goals Pain     Manual Therapy   Manual Therapy Soft tissue mobilization   Soft tissue mobilization cervical paraspinals,occipital musculature, upper traps, levators                     PT Long Term Goals - 09/21/16 1131      PT LONG TERM GOAL #1   Title Independent with a HEP.   Time 4   Period Weeks   Status Achieved     PT LONG TERM GOAL #2   Title Right active cervical rotation= 45 degrees.   Time 4   Period Weeks   Status Achieved  AROM 45 degrees 09/21/16     PT LONG TERM GOAL #3   Title Perform ADL's with pain not > 4/10.   Time 4   Period Weeks   Status Achieved  pain less than 4/10 per patient 09/21/16               Plan - 09/21/16 1133    Clinical Impression Statement Patient has met all current goals today. Patient would like today to possibly last day, patient would like  to be put on hold and go to MD. Patient will call [place]. Patient is independent with HEP and able to perform his light ADL's with minimal pain. Patient had no pain pre or post treatment. Patient has improved ROM with right cervical rotation and has minimal tightness in cervial UT area. Patient requested modalites at end of treatment for tight muscles.    Rehab Potential Fair   PT Frequency 2x / week   PT Duration 4 weeks   PT Treatment/Interventions ADLs/Self Care Home Management;Electrical Stimulation;Moist Heat;Ultrasound;Patient/family education;Therapeutic exercise;Therapeutic activities;Manual techniques   PT Next Visit Plan on hold   Consulted and Agree with Plan of Care Patient      Patient will benefit from skilled therapeutic intervention in order to improve the following deficits and impairments:  Pain, Decreased range of motion, Postural dysfunction, Decreased activity tolerance  Visit Diagnosis: Cervicalgia  Abnormal posture     Problem List Patient Active Problem List   Diagnosis Date Noted  . Cervical spondylosis with radiculopathy 06/15/2016    CLadean Raya PTA 09/21/16 12:01 PM  [facility] [address] [address]Phone: [phone]  Fax:  [phone] Name: Jon VERSTRAETEMRN: [id number] of Birth: 126-Jun-1962

## 2016-10-04 ENCOUNTER — Encounter: Payer: BLUE CROSS/BLUE SHIELD | Admitting: Physical Therapy

## 2016-10-05 ENCOUNTER — Encounter: Payer: Self-pay | Admitting: Physical Therapy

## 2016-10-05 ENCOUNTER — Ambulatory Visit: Payer: BLUE CROSS/BLUE SHIELD | Admitting: Physical Therapy

## 2016-10-05 DIAGNOSIS — M542 Cervicalgia: Secondary | ICD-10-CM

## 2016-10-05 DIAGNOSIS — R293 Abnormal posture: Secondary | ICD-10-CM

## 2016-10-05 NOTE — Therapy (Addendum)
[facility] [place], [place], [address] Phone: [phone]   Fax:  [phone]  Physical Therapy Treatment  Patient Details  Name: Jon Wong MRN: [id number] Date of Birth: 07/24/1960 Referring Provider: Ashok Pall MD.  Encounter Date: 10/05/2016      PT End of Session - 10/05/16 1058    Visit Number 8   Number of Visits 8   Date for PT Re-Evaluation 09/19/16   PT Start Time 3291   PT Stop Time 1117   PT Time Calculation (min) 48 min   Activity Tolerance Patient tolerated treatment well   Behavior During Therapy [facility] for tasks assessed/performed      Past Medical History:  Diagnosis Date  . Afib ([place])    pt denies this  . Anxiety   . Arthritis    neck  . 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 Constipation   . Diabetes mellitus without complication ([place])    type 2  . Family history of adverse reaction to anesthesia    mom is allergic to anesthesia per pt  . GERD (gastroesophageal reflux disease)   . Headache    migraines   . History of kidney stones    27 stones  . Hypertension   . Kidney stones   . 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 Pneumonia   . Ruptured disk     Past Surgical History:  Procedure Laterality Date  . BACK SURGERY     cervical spine surgery  . CARPAL TUNNEL RELEASE Right 2010  . COLONOSCOPY    . HAND SURGERY Right    removed a bone from hand  . POSTERIOR CERVICAL FUSION/FORAMINOTOMY N/A 06/15/2016   Procedure: CERVICAL THREE- FOUR CERVICAL FOUR- FIVE CERVICAL FIVE-SIX CERVICAL SIX- SEVEN POSTERIOR CERVICAL FUSION WITH LATERAL MASS FIXATION;  Surgeon: Leeroy Cha, MD;  Location: [facility];  Service: Neurosurgery;  Laterality: N/A;  C3-4 C4-5 C5-6 C6-7 POSTERIOR CERVICAL FUSION WITH LATERAL MASS FIXATION    There were no vitals filed for this visit.      Subjective Assessment - 10/05/16 1035    Subjective Patient arrived and feeling some increased pain and has  noticed decreased in activity tolerance   Pertinent History 3 cervical surgeries.   Limitations Walking;Standing   How long can you stand comfortably? One hour.   How long can you walk comfortably? 2 hrs   Patient Stated Goals Reduce pain and get rid of headaches.   Currently in Pain? Yes   Pain Score 4    Pain Location Neck   Pain Orientation Upper;Right;Left  middle of incision   Pain Descriptors / Indicators Stabbing   Pain Type Surgical pain   Pain Onset More than a month ago   Pain Frequency Intermittent   Aggravating Factors  reaching or overhead, or picking up anthing heavy   Pain Relieving Factors meds                         [facility] Adult PT Treatment/Exercise - 10/05/16 0001      Neck Exercises: Standing   Other Standing Exercises Green XTS for rows 3x10   Other Standing Exercises chin tuck with bil 2# scaption 2x10     Neck Exercises: Seated   X to V 20 reps   Other Seated Exercise seated nustep L5 x58mn with cervical posture focus and UE strength, monitored for progression  Shoulder Exercises: Standing   Protraction Strengthening;Right;Theraband  3x10   Theraband Level (Shoulder Protraction) Level 1 (Yellow)   External Rotation Strengthening;Right;Theraband  3x10   Theraband Level (Shoulder External Rotation) Level 1 (Yellow)   Internal Rotation Strengthening;Right;20 reps;Theraband  3x10   Theraband Level (Shoulder Internal Rotation) Level 1 (Yellow)     Shoulder Exercises: ROM/Strengthening   UBE (Upper Arm Bike) 120 RPM x6 min     Moist Heat Therapy   Number Minutes Moist Heat 15 Minutes   Moist Heat Location Cervical     Electrical Stimulation   Electrical Stimulation Location bilateral Upper Trap    Electrical Stimulation Action IFC   Electrical Stimulation Parameters 1-'10hz'  x59mn   Electrical Stimulation Goals Pain                     PT Long Term Goals - 09/21/16 1131      PT LONG TERM GOAL #1   Title  Independent with a HEP.   Time 4   Period Weeks   Status Achieved     PT LONG TERM GOAL #2   Title Right active cervical rotation= 45 degrees.   Time 4   Period Weeks   Status Achieved  AROM 45 degrees 09/21/16     PT LONG TERM GOAL #3   Title Perform ADL's with pain not > 4/10.   Time 4   Period Weeks   Status Achieved  pain less than 4/10 per patient 09/21/16               Plan - 10/05/16 1100    Clinical Impression Statement Patient has met all current goals yet has reported a feeling of pain that may be nerve related mid cervical area. Patient tolerated treatment well today. Patient has some complints of decreased activity tolerance and unable to lift anything heavy at this time due to increased pain with lifting or overhead movement. Patient is going to MD tomorrow and will discuss further plans for his functional indepencene, lifting and pain concerns.   Rehab Potential Fair   PT Frequency 2x / week   PT Duration 4 weeks   PT Treatment/Interventions ADLs/Self Care Home Management;Electrical Stimulation;Moist Heat;Ultrasound;Patient/family education;Therapeutic exercise;Therapeutic activities;Manual techniques   PT Next Visit Plan to MD. CChristella Noa  Consulted and Agree with Plan of Care Patient      Patient will benefit from skilled therapeutic intervention in order to improve the following deficits and impairments:  Pain, Decreased range of motion, Postural dysfunction, Decreased activity tolerance  Visit Diagnosis: Cervicalgia  Abnormal posture     Problem List Patient Active Problem List   Diagnosis Date Noted  . Cervical spondylosis with radiculopathy 06/15/2016    CLadean Raya PTA 10/05/16 12:24 PM CMaliApplegate MPT [facility] [address] [address]Phone: [phone]  Fax:  [phone] Name: RNAGI FURIOMRN: [id number] of Birth: 101/23/1962 PHYSICAL THERAPY DISCHARGE  SUMMARY  Visits from Start of Care: 8.  Current functional level related to goals / functional outcomes: See above.   Remaining deficits: All goals met.   Education / Equipment: HEP. Plan: Patient agrees to discharge.  Patient goals were met. Patient is being discharged due to meeting the stated rehab goals.  ?????         CMaliApplegate MPT

## 2017-04-19 ENCOUNTER — Ambulatory Visit: Payer: BLUE CROSS/BLUE SHIELD | Admitting: Neurology

## 2017-08-11 ENCOUNTER — Other Ambulatory Visit: Payer: Self-pay | Admitting: Neurosurgery

## 2017-08-11 DIAGNOSIS — M5412 Radiculopathy, cervical region: Secondary | ICD-10-CM

## 2017-08-24 ENCOUNTER — Ambulatory Visit
Admission: RE | Admit: 2017-08-24 | Discharge: 2017-08-24 | Disposition: A | Discharge status: Home / Self Care | Payer: BLUE CROSS/BLUE SHIELD | Source: Ambulatory Visit | Attending: Neurosurgery | Admitting: Neurosurgery

## 2017-08-24 DIAGNOSIS — M5412 Radiculopathy, cervical region: Secondary | ICD-10-CM

## 2017-08-24 MED ORDER — IOPAMIDOL (ISOVUE-M 200) INJECTION 41%
10.0000 mL | Freq: Once | INTRAMUSCULAR | Status: AC
  Administered 2017-08-24: 10 mL via INTRATHECAL

## 2017-08-24 MED ORDER — ONDANSETRON HCL 4 MG/2ML IJ SOLN: 4.0000 mg | Freq: Four times a day (QID) | INTRAMUSCULAR | Status: DC | PRN

## 2017-08-24 MED ORDER — ONDANSETRON HCL 4 MG/2ML IJ SOLN
4.0000 mg | Freq: Once | INTRAMUSCULAR | Status: AC
  Administered 2017-08-24: 4 mg via INTRAMUSCULAR

## 2017-08-24 MED ORDER — DIAZEPAM 5 MG PO TABS
10.0000 mg | ORAL_TABLET | Freq: Once | ORAL | Status: AC
  Administered 2017-08-24: 10 mg via ORAL

## 2017-08-24 MED ORDER — MEPERIDINE HCL 100 MG/ML IJ SOLN
75.0000 mg | Freq: Once | INTRAMUSCULAR | Status: AC
  Administered 2017-08-24: 75 mg via INTRAMUSCULAR

## 2017-08-24 NOTE — Discharge Instructions (Signed)

## 2017-09-05 ENCOUNTER — Ambulatory Visit: Payer: BLUE CROSS/BLUE SHIELD | Attending: Neurosurgery | Admitting: Physical Therapy

## 2017-09-05 ENCOUNTER — Other Ambulatory Visit: Payer: Self-pay

## 2017-09-05 ENCOUNTER — Encounter: Payer: Self-pay | Admitting: Physical Therapy

## 2017-09-05 DIAGNOSIS — M542 Cervicalgia: Secondary | ICD-10-CM | POA: Diagnosis present

## 2017-09-05 DIAGNOSIS — G5621 Lesion of ulnar nerve, right upper limb: Secondary | ICD-10-CM | POA: Diagnosis not present

## 2017-09-05 DIAGNOSIS — R293 Abnormal posture: Secondary | ICD-10-CM | POA: Diagnosis present

## 2017-09-05 DIAGNOSIS — M6281 Muscle weakness (generalized): Secondary | ICD-10-CM | POA: Diagnosis present

## 2017-09-05 DIAGNOSIS — M25521 Pain in right elbow: Secondary | ICD-10-CM

## 2017-09-05 NOTE — Therapy (Addendum)
[facility] [address] [place], [place], [address] Phone: [phone]   Fax:  [phone]  Physical Therapy Evaluation  Patient Details  Name: Jon Wong MRN: [id number] Date of Birth: 10-18-1960 Referring Provider: Coletta Memos MD   Encounter Date: 09/05/2017  PT End of Session - 09/05/17 2023    Visit Number  1    Number of Visits  8    Date for PT Re-Evaluation  10/10/17    PT Start Time  1346    PT Stop Time  1435    PT Time Calculation (min)  49 min    Activity Tolerance  Patient tolerated treatment well    Behavior During Therapy  [facility] for tasks assessed/performed       Past Medical History:  Diagnosis Date  . Afib ([facility])    pt denies this  . Anxiety   . Arthritis    neck  . 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 Constipation   . Diabetes mellitus without complication ([facility])    type 2  . Family history of adverse reaction to anesthesia    mom is allergic to anesthesia per pt  . GERD (gastroesophageal reflux disease)   . Headache    migraines   . History of kidney stones    27 stones  . Hypertension   . Kidney stones   . Neuropathy    in feet  . Pneumonia   . Ruptured disk     Past Surgical History:  Procedure Laterality Date  . BACK SURGERY     cervical spine surgery  . CARPAL TUNNEL RELEASE Right 2010  . COLONOSCOPY    . HAND SURGERY Right    removed a bone from hand  . POSTERIOR CERVICAL FUSION/FORAMINOTOMY N/A 06/15/2016   Procedure: CERVICAL THREE- FOUR CERVICAL FOUR- FIVE CERVICAL FIVE-SIX CERVICAL SIX- SEVEN POSTERIOR CERVICAL FUSION WITH LATERAL MASS FIXATION;  Surgeon: Hilda Lias, MD;  [facility];  Service: Neurosurgery;  Laterality: N/A;  C3-4 C4-5 C5-6 C6-7 POSTERIOR CERVICAL FUSION WITH LATERAL MASS FIXATION    There were no vitals filed for this visit.   Subjective Assessment - 09/05/17 2028    Subjective  Patient arrives to initial evaluation with  complaints of numbness and tingling in the elbow down to the pinky, ring finger, and lateral aspect of the hand. Patient reports feeling sore and a "heaviness" in shoulders after 2-3 hours of standing and after any activity. Patient states symptoms began after cervical fusion surgery of C3-T1 on 06/15/16. Neurological symptoms occur constantly. Nerve conduction study was performed and concluded a pinched nerve in the right elbow. During evaluation, patient's right ring finger began fasciulating. Patient stated it happens occasionally as well as "cramping" of first finger and thumb. Patient states pain is at best in the morning 2/10 and worst after spending the day out, walking, lifting, reaching and extending arms, 8/10. Patient's goal is to "go all day (8 hours) without pain."    Pertinent History  C3-T1 cervical fusion,     Limitations  House hold activities;Standing;Walking;Lifting    Patient Stated Goals  go all day (8 hours) without pain    Currently in Pain?  Yes    Pain Score  7     Pain Location  Elbow    Pain Orientation  Right    Pain Descriptors / Indicators  Numbness;Tingling    Pain Type  Chronic pain    Pain Onset  More than a month ago    Pain Frequency  Constant    Aggravating Factors   Standing, walking, lifting, extending arms    Pain Relieving Factors  rest, heating pad    Effect of Pain on Daily Activities  ADLs.         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   No    Is the patient reluctant to leave their home because of a fear of falling?   No      Home  Public house manager residence      Prior Function   Level of Independence  Independent      Sensation   Light Touch  Appears Intact;Impaired by gross assessment "lighter" sensation in R medial forearm only      Posture/Postural Control   Posture/Postural Control  Postural limitations    Postural Limitations  Rounded Shoulders;Forward head      Strength   Overall Strength Comments  Shoulder strength 5/5 in all planes, T1 myotome:    Strength Assessment Site  Elbow;Wrist;Forearm    Right/Left Elbow  Right    Right Elbow Flexion  4+/5    Right Elbow Extension  4+/5    Right/Left Forearm  Right    Right Forearm Pronation  5/5    Right Forearm Supination  5/5    Right/Left Wrist  Right      Special Tests   Other special tests  Biceps DTR 2+ brachioradialis and triceps 1+/4      Transfers   Transfers  Independent with all Transfers       AROM: limited cervical AROM secondary to cervical fusion; shoulder AROM WFL.       Objective measurements completed on examination: See above findings.              PT Education - 09/05/17 2022    Education provided  Yes    Education Details  Scap rectractions, shoulder rolls, proper posture    Person(s) Educated  Patient    Methods  Explanation;Demonstration;Handout    Comprehension  Verbalized understanding;Returned demonstration          PT Long Term Goals - 09/05/17 1544      PT LONG TERM GOAL #1   Title  Patient will be independent with HEP.    Time  4    Period  Weeks    Status  New    Target Date  10/03/17      PT LONG TERM GOAL #2   Title  Patient will report no neurological symptoms as an indicator of no nerve irritation.    Time  4    Period  Weeks    Status  New    Target Date  10/03/17      PT LONG TERM GOAL #3   Title  Patient will perform ADLs with pain less than 3/10.  Time  4    Period  Weeks    Status  New      PT LONG TERM GOAL #4   Title  Patient will report  improved activity endurance as noted by ability to perform home and recreational activities for 8 hours with minimal rest and less than 3/10 pain.    Time  4    Period  Weeks    Status  New             Plan - 09/05/17 1541    Clinical Impression Statement  Patient presents to physical therapy with complaints of R numbness and tingling from the elbow down to the pinky and ring finger secondary to R ulnar neuropathy. Patient decreased Triceps DTR. Patient has limited grip strength with pinky and ring finger (grossly assessed). Patient noted with right hypothenar eminence atrophy. Patient limited with finger abduction strength. Patient noted with rounded shoulders and forward head in sitting and standing. Patient would  benefit from skilled physical therapy to decrease neurological symptoms, decrease pain, and return to PLOF.     Clinical Presentation  Evolving    Clinical Decision Making  Low    Rehab Potential  Fair    PT Frequency  2x / week    PT Duration  4 weeks    PT Treatment/Interventions  ADLs/Self Care Home Management;Electrical Stimulation;Cryotherapy;Ultrasound;Moist Heat;Therapeutic exercise;Therapeutic activities;Passive range of motion;Manual techniques;Patient/family education;Neuromuscular re-education    PT Next Visit Plan  Assess grip strength with hand held dynamometer; Assess Froment's Sign Test. postural exercises, grip activities. ulnar nerve glides. Modalites PRN for pain relief.    Consulted and Agree with Plan of Care  Patient       Patient will benefit from skilled therapeutic intervention in order to improve the following deficits and impairments:  Pain, Impaired sensation, Impaired UE functional use, Decreased strength  Visit Diagnosis: Ulnar neuropathy at elbow, right  Pain in right elbow  Abnormal posture  Muscle weakness (generalized)     Problem List Patient Active Problem List   Diagnosis Date Noted  . Cervical spondylosis with radiculopathy  06/15/2016    Guss BundeKrystle Maurica Omura, PT, DPT 09/05/2017, 8:36 PM  [facility] [address] [place], [place], [address] Phone: [phone]   Fax:  [phone]  Name: Jon Wong MRN: [id number] Date of Birth: 08/05/1960

## 2017-09-05 NOTE — Patient Instructions (Signed)
   Marcedes Tech, PT, DPT [facility] [address] [place], [place], [address] Phone: [phone]   Fax:  [phone]  

## 2017-09-08 ENCOUNTER — Encounter: Payer: Self-pay | Admitting: Physical Therapy

## 2017-09-08 ENCOUNTER — Ambulatory Visit: Payer: BLUE CROSS/BLUE SHIELD | Admitting: Physical Therapy

## 2017-09-08 DIAGNOSIS — G5621 Lesion of ulnar nerve, right upper limb: Secondary | ICD-10-CM | POA: Diagnosis not present

## 2017-09-08 DIAGNOSIS — R293 Abnormal posture: Secondary | ICD-10-CM

## 2017-09-08 DIAGNOSIS — M25521 Pain in right elbow: Secondary | ICD-10-CM

## 2017-09-08 DIAGNOSIS — M6281 Muscle weakness (generalized): Secondary | ICD-10-CM

## 2017-09-08 NOTE — Therapy (Signed)
[facility] [address] [place], [place], [address] Phone: [phone]   Fax:  [phone]  Physical Therapy Treatment  Patient Details  Name: Deborra MedinaRandy O Dyches MRN: [id number] Date of Birth: 12/13/1960 Referring Provider: Coletta MemosKyle Cabbell MD   Encounter Date: 09/08/2017  PT End of Session - 09/08/17 0952    Visit Number  2    Number of Visits  8    Date for PT Re-Evaluation  10/10/17    PT Start Time  0948    PT Stop Time  1035    PT Time Calculation (min)  47 min    Activity Tolerance  Patient tolerated treatment well    Behavior During Therapy  [facility] for tasks assessed/performed       Past Medical History:  Diagnosis Date  . Afib ([facility])    pt denies this  . Anxiety   . Arthritis    neck  . 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 Constipation   . Diabetes mellitus without complication ([facility])    type 2  . Family history of adverse reaction to anesthesia    mom is allergic to anesthesia per pt  . GERD (gastroesophageal reflux disease)   . Headache    migraines   . History of kidney stones    27 stones  . Hypertension   . Kidney stones   . Neuropathy    in feet  . Pneumonia   . Ruptured disk     Past Surgical History:  Procedure Laterality Date  . BACK SURGERY     cervical spine surgery  . CARPAL TUNNEL RELEASE Right 2010  . COLONOSCOPY    . HAND SURGERY Right    removed a bone from hand  . POSTERIOR CERVICAL FUSION/FORAMINOTOMY N/A 06/15/2016   Procedure: CERVICAL THREE- FOUR CERVICAL FOUR- FIVE CERVICAL FIVE-SIX CERVICAL SIX- SEVEN POSTERIOR CERVICAL FUSION WITH LATERAL MASS FIXATION;  Surgeon: Hilda LiasErnesto Botero, MD;  [facility];  Service: Neurosurgery;  Laterality: N/A;  C3-4 C4-5 C5-6 C6-7 POSTERIOR CERVICAL FUSION WITH LATERAL MASS FIXATION    There were no vitals filed for this visit.  Subjective Assessment - 09/08/17 0949    Subjective  Reports that he has been doing exercises provided for  him but still feels like the nerve is pinched. Reports that R pinky finger is numb.    Pertinent History  C3-T1 cervical fusion,     Limitations  House hold activities;Standing;Walking;Lifting    How long can you stand comfortably?  One hour.    How long can you walk comfortably?  2 hrs    Patient Stated Goals  go all day (8 hours) without pain    Currently in Pain?  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    Onset Date/Surgical Date  06/05/16  Hand Dominance  Right    Next MD Visit  10/02/2017      Restrictions   Weight Bearing Restrictions  No      ROM / Strength   AROM / PROM / Strength  Strength      Strength   Overall Strength  Deficits    Strength Assessment Site  Hand    Right/Left hand  Right;Left    Right Hand Grip (lbs)  75    Left Hand Grip (lbs)  90                  [facility] Adult PT Treatment/Exercise - 09/08/17 0001      Exercises   Exercises  Neck;Shoulder;Elbow      Elbow Exercises   Elbow Flexion  Strengthening;Right;Other reps (comment);Seated;Bar weights/barbell reg bicep curl, brachioradialis curl     Bar Weights/Barbell (Elbow Flexion)  3 lbs    Elbow Flexion Limitations  x 30 reps    Elbow Extension  Strengthening;Right;Other reps (comment);Seated;Theraband    Theraband Level (Elbow Extension)  Level 2 (Red)    Elbow Extension Limitations  x30 reps    Other elbow exercises  R ulnar nerve glide without head side bend x20 reps    Other elbow exercises  R red digiflex x30 reps      Shoulder Exercises: Standing   Horizontal ABduction   Strengthening;Both;20 reps;Theraband    Theraband Level (Shoulder Horizontal ABduction)  Level 2 (Red)      Shoulder Exercises: ROM/Strengthening   UBE (Upper Arm Bike)  120 RPM x6 min 3 min forward, 3 min backward    Wall Pushups  10 reps limited due to fatigue      Modalities   Modalities  Electrical Stimulation;Moist Heat      Moist Heat Therapy   Number Minutes Moist Heat  15 Minutes    Moist Heat Location  Elbow      Electrical Stimulation   Electrical Stimulation Location  R medial elbow    Electrical Stimulation Action  Pre-Mod    Electrical Stimulation Parameters  80-150 hz x15 min    Electrical Stimulation Goals  Pain             PT Education - 09/08/17 1019    Education provided  Yes    Education Details  HEP- ulnar nerve glides     Person(s) Educated  Patient    Methods  Explanation;Handout    Comprehension  Verbalized understanding          PT Long Term Goals - 09/05/17 1544      PT LONG TERM GOAL #1   Title  Patient will be independent with HEP.    Time  4    Period  Weeks    Status  New    Target Date  10/03/17      PT LONG TERM GOAL #2   Title  Patient will report no neurological symptoms as an indicator of no nerve irritation.    Time  4    Period  Weeks    Status  New    Target Date  10/03/17      PT LONG TERM GOAL #3   Title  Patient will perform ADLs with pain less than 3/10.    Time  4    Period  Weeks    Status  New      PT LONG TERM GOAL #4   Title  Patient will report improved activity endurance as noted by  ability to perform home and recreational activities for 8 hours with minimal rest and less than 3/10 pain.    Time  4    Period  Weeks    Status  New            Plan - 09/08/17 1027    Clinical Impression Statement  Patiient presented in clinic with continued reports of R elbow pain and radiation of pain into R hand. Patient reported fatigue throughout treatment secondary to weakness of RUE and postural musculature.  R ulnar nerve glide intitally attempted with L cervical side bend but stopped secondary to patient feeling his neck pain. Decreased grip and R fifth digit ROM observed in clinic along with R hypothenar atrophy. Involuntary R finger and hand shaking observed in clinic as well. Patient educated regarding bracing that was discussed with MD for elbow extension during sleep as well as education regarding HEP. Normal modalities response noted following removal of the modalities.     Rehab Potential  Fair    PT Frequency  2x / week    PT Duration  4 weeks    PT Treatment/Interventions  ADLs/Self Care Home Management;Electrical Stimulation;Cryotherapy;Ultrasound;Moist Heat;Therapeutic exercise;Therapeutic activities;Passive range of motion;Manual techniques;Patient/family education;Neuromuscular re-education    PT Next Visit Plan  Continue with ulnar nerve glides and postural strengthening per DPT POC.    Consulted and Agree with Plan of Care  Patient       Patient will benefit from skilled therapeutic intervention in order to improve the following deficits and impairments:  Pain, Impaired sensation, Impaired UE functional use, Decreased strength  Visit Diagnosis: Ulnar neuropathy at elbow, right  Pain in right elbow  Abnormal posture  Muscle weakness (generalized)     Problem List Patient Active Problem List   Diagnosis Date Noted  . Cervical spondylosis with radiculopathy 06/15/2016    Marvell Fuller, PTA 09/08/2017, 10:49 AM  [facility] [address] [place], [place], [address] Phone: [phone]   Fax:  [phone]  Name: TALHA ISER MRN: [id number] Date of Birth: 09-28-1960

## 2017-09-12 ENCOUNTER — Encounter: Payer: Self-pay | Admitting: Physical Therapy

## 2017-09-12 ENCOUNTER — Ambulatory Visit: Payer: BLUE CROSS/BLUE SHIELD | Admitting: Physical Therapy

## 2017-09-12 DIAGNOSIS — G5621 Lesion of ulnar nerve, right upper limb: Secondary | ICD-10-CM | POA: Diagnosis not present

## 2017-09-12 DIAGNOSIS — M542 Cervicalgia: Secondary | ICD-10-CM

## 2017-09-12 DIAGNOSIS — M25521 Pain in right elbow: Secondary | ICD-10-CM

## 2017-09-12 DIAGNOSIS — M6281 Muscle weakness (generalized): Secondary | ICD-10-CM

## 2017-09-12 NOTE — Therapy (Signed)
[facility] [address] [place], [place], [address] Phone: [phone]   Fax:  [phone]  Physical Therapy Treatment  Patient Details  Name: Jon Wong MRN: [id number] Date of Birth: 12/29/1960 Referring Provider: Coletta MemosKyle Cabbell MD   Encounter Date: 09/12/2017  PT End of Session - 09/12/17 1055    Visit Number  3    Number of Visits  8    Date for PT Re-Evaluation  10/10/17    PT Start Time  0945    PT Stop Time  1031    PT Time Calculation (min)  46 min       Past Medical History:  Diagnosis Date  . Afib (HCC)    pt denies this  . Anxiety   . Arthritis    neck  . 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 Constipation   . Diabetes mellitus without complication (HCC)    type 2  . Family history of adverse reaction to anesthesia    mom is allergic to anesthesia per pt  . GERD (gastroesophageal reflux disease)   . Headache    migraines   . History of kidney stones    27 stones  . Hypertension   . Kidney stones   . Neuropathy    in feet  . Pneumonia   . Ruptured disk     Past Surgical History:  Procedure Laterality Date  . BACK SURGERY     cervical spine surgery  . CARPAL TUNNEL RELEASE Right 2010  . COLONOSCOPY    . HAND SURGERY Right    removed a bone from hand  . POSTERIOR CERVICAL FUSION/FORAMINOTOMY N/A 06/15/2016   Procedure: CERVICAL THREE- FOUR CERVICAL FOUR- FIVE CERVICAL FIVE-SIX CERVICAL SIX- SEVEN POSTERIOR CERVICAL FUSION WITH LATERAL MASS FIXATION;  Surgeon: Hilda LiasErnesto Botero, MD;  Location: [facility] OR;  Service: Neurosurgery;  Laterality: N/A;  C3-4 C4-5 C5-6 C6-7 POSTERIOR CERVICAL FUSION WITH LATERAL MASS FIXATION    There were no vitals filed for this visit.  Subjective Assessment - 09/12/17 1108    Subjective  That last treatment helped.    Limitations  House hold activities;Standing;Walking;Lifting    Patient Stated Goals  go all day (8 hours) without pain    Pain Score  3     Pain Location  Elbow    Pain Orientation  Right    Pain Descriptors / Indicators  Numbness;Tingling    Pain Onset  More than a month ago                      [facility]Ex[facility] - 09/12/17 0001      Modalities   Modalities  Ultrasound      Electrical Stimulation   Electrical Stimulation Location  Right medial elbow.    Electrical Stimulation Action  Modulated x 15 minutes.    Electrical Stimulation Goals  Pain      Ultrasound   Ultrasound Location  Right medial elbow.    Ultrasound Parameters  1.50 W/cm2 x at 3.3 Mhz at 20% x 12 minutes.    Ultrasound Goals  Pain      Manual Therapy   Manual Therapy  Soft tissue mobilization    Manual therapy comments  IASTM medial forearm and just below the medial epicondyle x 11 minutes.                  PT Long Term Goals - 09/05/17 1544  PT LONG TERM GOAL #1   Title  Patient will be independent with HEP.    Time  4    Period  Weeks    Status  New    Target Date  10/03/17      PT LONG TERM GOAL #2   Title  Patient will report no neurological symptoms as an indicator of no nerve irritation.    Time  4    Period  Weeks    Status  New    Target Date  10/03/17      PT LONG TERM GOAL #3   Title  Patient will perform ADLs with pain less than 3/10.    Time  4    Period  Weeks    Status  New      PT LONG TERM GOAL #4   Title  Patient will report improved activity endurance as noted by ability to perform home and recreational activities for 8 hours with minimal rest and less than 3/10 pain.    Time  4    Period  Weeks    Status  New            Plan - 09/12/17 1113    Clinical Impression Statement  The did well with treatment with feeling better following.  CC today is numbness of right 4th and fifth fingers.    PT Treatment/Interventions  ADLs/Self Care Home Management;Electrical Stimulation;Cryotherapy;Ultrasound;Moist Heat;Therapeutic exercise;Therapeutic activities;Passive range  of motion;Manual techniques;Patient/family education;Neuromuscular re-education    PT Next Visit Plan  Continue with ulnar nerve glides and postural strengthening per DPT POC.    Consulted and Agree with Plan of Care  Patient       Patient will benefit from skilled therapeutic intervention in order to improve the following deficits and impairments:  Pain, Impaired sensation, Impaired UE functional use, Decreased strength  Visit Diagnosis: Ulnar neuropathy at elbow, right  Pain in right elbow  Muscle weakness (generalized)  Cervicalgia     Problem List Patient Active Problem List   Diagnosis Date Noted  . Cervical spondylosis with radiculopathy 06/15/2016    Thurley Francesconi, [place]  MPT 09/12/2017, 11:16 AM  [facility] [address] [place], [place], [address] Phone: [phone]   Fax:  [phone]  Name: Jon Wong MRN: [id number] Date of Birth: [birth year]

## 2017-09-15 ENCOUNTER — Encounter: Payer: Self-pay | Admitting: Physical Therapy

## 2017-09-15 ENCOUNTER — Ambulatory Visit: Payer: BLUE CROSS/BLUE SHIELD | Attending: Neurosurgery | Admitting: Physical Therapy

## 2017-09-15 DIAGNOSIS — G5621 Lesion of ulnar nerve, right upper limb: Secondary | ICD-10-CM | POA: Diagnosis present

## 2017-09-15 DIAGNOSIS — M6281 Muscle weakness (generalized): Secondary | ICD-10-CM

## 2017-09-15 DIAGNOSIS — M25521 Pain in right elbow: Secondary | ICD-10-CM | POA: Insufficient documentation

## 2017-09-15 NOTE — Therapy (Signed)
[facility] [address] [place], [place], [address] Phone: [phone]   Fax:  [phone]  Physical Therapy Treatment  Patient Details  Name: Jon MedinaRandy O Orsino MRN: [id number] Date of Birth: 10/08/1960 Referring Provider: Coletta MemosKyle Cabbell MD   Encounter Date: 09/15/2017  PT End of Session - 09/15/17 1042    Visit Number  4    Number of Visits  8    Date for PT Re-Evaluation  10/10/17    PT Start Time  1030    PT Stop Time  1121    PT Time Calculation (min)  51 min    Activity Tolerance  Patient tolerated treatment well    Behavior During Therapy  [facility] for tasks assessed/performed       Past Medical History:  Diagnosis Date  . Afib (HCC)    pt denies this  . Anxiety   . Arthritis    neck  . 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 Constipation   . Diabetes mellitus without complication (HCC)    type 2  . Family history of adverse reaction to anesthesia    mom is allergic to anesthesia per pt  . GERD (gastroesophageal reflux disease)   . Headache    migraines   . History of kidney stones    27 stones  . Hypertension   . Kidney stones   . Neuropathy    in feet  . Pneumonia   . Ruptured disk     Past Surgical History:  Procedure Laterality Date  . BACK SURGERY     cervical spine surgery  . CARPAL TUNNEL RELEASE Right 2010  . COLONOSCOPY    . HAND SURGERY Right    removed a bone from hand  . POSTERIOR CERVICAL FUSION/FORAMINOTOMY N/A 06/15/2016   Procedure: CERVICAL THREE- FOUR CERVICAL FOUR- FIVE CERVICAL FIVE-SIX CERVICAL SIX- SEVEN POSTERIOR CERVICAL FUSION WITH LATERAL MASS FIXATION;  Surgeon: Hilda LiasErnesto Botero, MD;  Location: [facility] OR;  Service: Neurosurgery;  Laterality: N/A;  C3-4 C4-5 C5-6 C6-7 POSTERIOR CERVICAL FUSION WITH LATERAL MASS FIXATION    There were no vitals filed for this visit.  Subjective Assessment - 09/15/17 1037    Subjective  Reports that he received the elbow extension brace for  sleeping which has helped he thinks.     Pertinent History  C3-T1 cervical fusion,     Limitations  House hold activities;Standing;Walking;Lifting    How long can you stand comfortably?  One hour.    How long can you walk comfortably?  2 hrs    Patient Stated Goals  go all day (8 hours) without pain    Currently in Pain?  Yes    Pain Score  2     Pain Location  Elbow    Pain Orientation  Right    Pain Descriptors / Indicators  Tingling R fifth finger tingling    Pain Type  Chronic pain    Pain Onset  More than a month ago         [facility] - 09/15/17 0001      Assessment   Medical Diagnosis  Right Ulnar neuropathy at elbow    Onset Date/Surgical Date  06/05/16    Hand Dominance  Right    Next MD Visit  10/02/2017      Restrictions   Weight Bearing Restrictions  No  [facility] Adult PT Treatment/Exercise - 09/15/17 0001      Elbow Exercises   Elbow Flexion  Strengthening;Both;20 reps;Seated;Bar weights/barbell    Bar Weights/Barbell (Elbow Flexion)  3 lbs    Elbow Flexion Limitations  Palm up and down, brachioradialis    Elbow Extension  Strengthening;Right;Seated;Theraband;20 reps    Theraband Level (Elbow Extension)  Level 2 (Red)    Other elbow exercises  R ulnar nerve glide without head side bend x10 reps    Other elbow exercises  R hand orange putty pinch, clothespin       Shoulder Exercises: Standing   Horizontal ABduction  Strengthening;Both;20 reps;Theraband    Theraband Level (Shoulder Horizontal ABduction)  Level 2 (Red)    Other Standing Exercises  Wall walks yellow theraband x1 RT      Shoulder Exercises: ROM/Strengthening   UBE (Upper Arm Bike)  120 RPM x6 min    Wall Pushups  20 reps began feeling weak      Modalities   Modalities  Electrical Stimulation;Moist Heat      Moist Heat Therapy   Number Minutes Moist Heat  15 Minutes    Moist Heat Location  Elbow      Electrical Stimulation   Electrical Stimulation  Location  Right medial elbow.    Electrical Stimulation Action  Pre-Mod    Electrical Stimulation Parameters  80-150 hz x15 min    Electrical Stimulation Goals  Pain                  PT Long Term Goals - 09/05/17 1544      PT LONG TERM GOAL #1   Title  Patient will be independent with HEP.    Time  4    Period  Weeks    Status  New    Target Date  10/03/17      PT LONG TERM GOAL #2   Title  Patient will report no neurological symptoms as an indicator of no nerve irritation.    Time  4    Period  Weeks    Status  New    Target Date  10/03/17      PT LONG TERM GOAL #3   Title  Patient will perform ADLs with pain less than 3/10.    Time  4    Period  Weeks    Status  New      PT LONG TERM GOAL #4   Title  Patient will report improved activity endurance as noted by ability to perform home and recreational activities for 8 hours with minimal rest and less than 3/10 pain.    Time  4    Period  Weeks    Status  New            Plan - 09/15/17 1112    Clinical Impression Statement  Patient tolerated today's treatment well with intermittant complaints throughout treatment of weakness in RUE and hand. Cramping of R thenar musclature and first finger observed with more fine motor strengthening exercises with orange putty as well as clothespin. Postural and elbow strengthening completed today with reports of fatigue. Patient corrected with R ulnar nerve glides for elbow flexion instead of elbow extension with patient feeling more involvement and stretch with elbow flexion. Normal modalities response noted following removal of the modalities.    Rehab Potential  Fair    PT Frequency  2x / week    PT Duration  4 weeks    PT Treatment/Interventions  ADLs/Self Care Home Management;Electrical Stimulation;Cryotherapy;Ultrasound;Moist Heat;Therapeutic exercise;Therapeutic activities;Passive range of motion;Manual techniques;Patient/family education;Neuromuscular re-education     PT Next Visit Plan  Continue with ulnar nerve glides and postural strengthening per DPT POC.    Consulted and Agree with Plan of Care  Patient       Patient will benefit from skilled therapeutic intervention in order to improve the following deficits and impairments:  Pain, Impaired sensation, Impaired UE functional use, Decreased strength  Visit Diagnosis: Ulnar neuropathy at elbow, right  Pain in right elbow  Muscle weakness (generalized)     Problem List Patient Active Problem List   Diagnosis Date Noted  . Cervical spondylosis with radiculopathy 06/15/2016    Marvell Fuller, PTA 09/15/2017, 11:29 AM  [facility] [address] [place], [place], [address] Phone: [phone]   Fax:  [phone]  Name: TRIPP GOINS MRN: [id number] Date of Birth: [birth year]

## 2017-09-19 ENCOUNTER — Ambulatory Visit: Payer: BLUE CROSS/BLUE SHIELD | Admitting: *Deleted

## 2017-09-19 DIAGNOSIS — M25521 Pain in right elbow: Secondary | ICD-10-CM

## 2017-09-19 DIAGNOSIS — G5621 Lesion of ulnar nerve, right upper limb: Secondary | ICD-10-CM | POA: Diagnosis not present

## 2017-09-19 DIAGNOSIS — M6281 Muscle weakness (generalized): Secondary | ICD-10-CM

## 2017-09-19 NOTE — Therapy (Signed)
[facility] [address] [place], [place], [address] Phone: [phone]   Fax:  [phone]  Physical Therapy Treatment  Patient Details  Name: Jon Wong MRN: [id number] Date of Birth: [birth year] Referring Provider: Coletta Memos MD   Encounter Date: 09/19/2017  PT End of Session - 09/19/17 1351    Visit Number  5    Number of Visits  8    Date for PT Re-Evaluation  10/10/17    PT Start Time  1345    PT Stop Time  1435    PT Time Calculation (min)  50 min       Past Medical History:  Diagnosis Date  . Afib (HCC)    pt denies this  . Anxiety   . Arthritis    neck  . 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 Constipation   . Diabetes mellitus without complication (HCC)    type 2  . Family history of adverse reaction to anesthesia    mom is allergic to anesthesia per pt  . GERD (gastroesophageal reflux disease)   . Headache    migraines   . History of kidney stones    27 stones  . Hypertension   . Kidney stones   . Neuropathy    in feet  . Pneumonia   . Ruptured disk     Past Surgical History:  Procedure Laterality Date  . BACK SURGERY     cervical spine surgery  . CARPAL TUNNEL RELEASE Right 2010  . COLONOSCOPY    . HAND SURGERY Right    removed a bone from hand  . POSTERIOR CERVICAL FUSION/FORAMINOTOMY N/A 06/15/2016   Procedure: CERVICAL THREE- FOUR CERVICAL FOUR- FIVE CERVICAL FIVE-SIX CERVICAL SIX- SEVEN POSTERIOR CERVICAL FUSION WITH LATERAL MASS FIXATION;  Surgeon: Hilda Lias, MD;  Location: [facility] OR;  Service: Neurosurgery;  Laterality: N/A;  C3-4 C4-5 C5-6 C6-7 POSTERIOR CERVICAL FUSION WITH LATERAL MASS FIXATION    There were no vitals filed for this visit.  Subjective Assessment - 09/19/17 1346    Subjective  Reports that he received the elbow extension brace for sleeping which has helped he thinks. Brace made my bicep cramp and is very uncomfortable    Pertinent History  C3-T1  cervical fusion,     Limitations  House hold activities;Standing;Walking;Lifting    How long can you stand comfortably?  One hour.    How long can you walk comfortably?  2 hrs    Patient Stated Goals  go all day (8 hours) without pain    Currently in Pain?  Yes    Pain Score  3     Pain Location  Elbow    Pain Descriptors / Indicators  Numbness;Tingling    Pain Onset  More than a month ago                      [facility] Adult PT Treatment/Exercise - 09/19/17 0001      Exercises   Exercises  Neck;Shoulder;Elbow;Hand      Elbow Exercises   Elbow Flexion  Strengthening;Both;20 reps;Seated;Bar weights/barbell 3x10 with palm up, down and in neutral    Bar Weights/Barbell (Elbow Flexion)  3 lbs    Elbow Extension  Strengthening;Right;Seated;20 reps Pulleys 20#s    Theraband Level (Elbow Extension)  --    Other elbow exercises  R ulnar nerve glide without head side bend x10 reps      Shoulder Exercises:  Standing   Horizontal ABduction  Strengthening;Both;20 reps;Theraband    Theraband Level (Shoulder Horizontal ABduction)  Level 2 (Red)    Other Standing Exercises  Wall walks yellow theraband x1 RT      Shoulder Exercises: ROM/Strengthening   UBE (Upper Arm Bike)  120 RPM x6 min    Wall Pushups  20 reps began feeling weak      Hand Exercises   Thumb Opposition  power web    Digiticizer  redx [id number]      Modalities   Modalities  Electrical Stimulation;Moist Heat      Moist Heat Therapy   Number Minutes Moist Heat  15 Minutes    Moist Heat Location  Elbow      Electrical Stimulation   Electrical Stimulation Location  Right medial elbow.Premod x 15 mins 80-150hz     Electrical Stimulation Goals  Pain                  PT Long Term Goals - 09/05/17 1544      PT LONG TERM GOAL #1   Title  Patient will be independent with HEP.    Time  4    Period  Weeks    Status  New    Target Date  10/03/17      PT LONG TERM GOAL #2   Title  Patient will report no  neurological symptoms as an indicator of no nerve irritation.    Time  4    Period  Weeks    Status  New    Target Date  10/03/17      PT LONG TERM GOAL #3   Title  Patient will perform ADLs with pain less than 3/10.    Time  4    Period  Weeks    Status  New      PT LONG TERM GOAL #4   Title  Patient will report improved activity endurance as noted by ability to perform home and recreational activities for 8 hours with minimal rest and less than 3/10 pain.    Time  4    Period  Weeks    Status  New            Plan - 09/19/17 1352    Clinical Impression Statement  Pt arrived today doing fairly well, but was complaining of some cramping in RT  bicep while wearing extension brace. He says it keeps him fairly sore, but still wears it. Pt did well with therex today , but  did have intermittent cramping of his RT hand. Normal response to modalities and was doing well end of Rx.    Clinical Presentation  Evolving    Clinical Decision Making  Low    Rehab Potential  Fair    PT Frequency  2x / week    PT Duration  4 weeks    PT Treatment/Interventions  ADLs/Self Care Home Management;Electrical Stimulation;Cryotherapy;Ultrasound;Moist Heat;Therapeutic exercise;Therapeutic activities;Passive range of motion;Manual techniques;Patient/family education;Neuromuscular re-education    PT Next Visit Plan  Continue with ulnar nerve glides and postural strengthening per DPT POC.    Consulted and Agree with Plan of Care  Patient       Patient will benefit from skilled therapeutic intervention in order to improve the following deficits and impairments:  Pain, Impaired sensation, Impaired UE functional use, Decreased strength  Visit Diagnosis: Ulnar neuropathy at elbow, right  Pain in right elbow  Muscle weakness (generalized)     Problem List Patient Active  Problem List   Diagnosis Date Noted  . Cervical spondylosis with radiculopathy 06/15/2016    RAMSEUR,CHRIS, PTA 09/19/2017,  3:46 PM  [facility] [address] [place], [place], [address] Phone: [phone]   Fax:  [phone]  Name: Jon Wong MRN: [id number] Date of Birth: 08-20-1960

## 2017-09-22 ENCOUNTER — Encounter: Payer: Self-pay | Admitting: Physical Therapy

## 2017-09-22 ENCOUNTER — Ambulatory Visit: Payer: BLUE CROSS/BLUE SHIELD | Admitting: Physical Therapy

## 2017-09-22 DIAGNOSIS — G5621 Lesion of ulnar nerve, right upper limb: Secondary | ICD-10-CM

## 2017-09-22 DIAGNOSIS — M25521 Pain in right elbow: Secondary | ICD-10-CM

## 2017-09-22 NOTE — Therapy (Signed)
[facility] [address] [place], [place], [address] Phone: [phone]   Fax:  [phone]  Physical Therapy Treatment  Patient Details  Name: Jon Wong MRN: [id number] Date of Birth: 1960/09/10 Referring Provider: Coletta Memos MD   Encounter Date: 09/22/2017  PT End of Session - 09/22/17 1130    Visit Number  6    Number of Visits  8    Date for PT Re-Evaluation  10/10/17    PT Start Time  1117    PT Stop Time  1153 2 units secondary to leave early    PT Time Calculation (min)  36 min    Activity Tolerance  Patient tolerated treatment well    Behavior During Therapy  [facility] for tasks assessed/performed       Past Medical History:  Diagnosis Date  . Afib (HCC)    pt denies this  . Anxiety   . Arthritis    neck  . 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 Constipation   . Diabetes mellitus without complication (HCC)    type 2  . Family history of adverse reaction to anesthesia    mom is allergic to anesthesia per pt  . GERD (gastroesophageal reflux disease)   . Headache    migraines   . History of kidney stones    27 stones  . Hypertension   . Kidney stones   . Neuropathy    in feet  . Pneumonia   . Ruptured disk     Past Surgical History:  Procedure Laterality Date  . BACK SURGERY     cervical spine surgery  . CARPAL TUNNEL RELEASE Right 2010  . COLONOSCOPY    . HAND SURGERY Right    removed a bone from hand  . POSTERIOR CERVICAL FUSION/FORAMINOTOMY N/A 06/15/2016   Procedure: CERVICAL THREE- FOUR CERVICAL FOUR- FIVE CERVICAL FIVE-SIX CERVICAL SIX- SEVEN POSTERIOR CERVICAL FUSION WITH LATERAL MASS FIXATION;  Surgeon: Hilda Lias, MD;  Location: [facility] OR;  Service: Neurosurgery;  Laterality: N/A;  C3-4 C4-5 C5-6 C6-7 POSTERIOR CERVICAL FUSION WITH LATERAL MASS FIXATION    There were no vitals filed for this visit.  Subjective Assessment - 09/22/17 1123    Subjective  Reports that the brace  irritates him at night. Reports that he sees some improvement but its slow. Reports that he seen improvvement following [place] and IASTW. Was seen for bronchitis yesterday and pain has gone up since then.    Pertinent History  C3-T1 cervical fusion,     Limitations  House hold activities;Standing;Walking;Lifting    How long can you stand comfortably?  One hour.    How long can you walk comfortably?  2 hrs    Patient Stated Goals  go all day (8 hours) without pain    Currently in Pain?  Yes    Pain Score  5     Pain Location  Elbow    Pain Orientation  Right;Medial;Lateral    Pain Descriptors / Indicators  Discomfort    Pain Type  Chronic pain    Pain Onset  More than a month ago         [facility] PT Assessment - 09/22/17 0001      Assessment   Medical Diagnosis  Right Ulnar neuropathy at elbow    Onset Date/Surgical Date  06/05/16    Hand Dominance  Right    Next MD Visit  10/02/2017      Restrictions  Weight Bearing Restrictions  No                  [facility] Adult PT Treatment/Exercise - 09/22/17 0001      Elbow Exercises   Elbow Flexion  Strengthening;Both;20 reps;Seated;Bar weights/barbell palm up, thumb up    Bar Weights/Barbell (Elbow Flexion)  3 lbs      Shoulder Exercises: ROM/Strengthening   UBE (Upper Arm Bike)  90 RPM x8 min      Modalities   Modalities  Ultrasound      Ultrasound   Ultrasound Location  R medial and lateral elbow    Ultrasound Parameters  1.5 w/cm2, 100%, 1 mhz x10 min    Ultrasound Goals  Pain      Manual Therapy   Manual Therapy  Myofascial release    Myofascial Release  IASTW to R medial and lateral elbow to reduce pain and muscle tightness                  PT Long Term Goals - 09/05/17 1544      PT LONG TERM GOAL #1   Title  Patient will be independent with HEP.    Time  4    Period  Weeks    Status  New    Target Date  10/03/17      PT LONG TERM GOAL #2   Title  Patient will report no neurological symptoms as an  indicator of no nerve irritation.    Time  4    Period  Weeks    Status  New    Target Date  10/03/17      PT LONG TERM GOAL #3   Title  Patient will perform ADLs with pain less than 3/10.    Time  4    Period  Weeks    Status  New      PT LONG TERM GOAL #4   Title  Patient will report improved activity endurance as noted by ability to perform home and recreational activities for 8 hours with minimal rest and less than 3/10 pain.    Time  4    Period  Weeks    Status  New            Plan - 09/22/17 1222    Clinical Impression Statement  Patient tolerated today's treatment well although he reported increased pain since developing bronchitis. Patient reported improvement following US and IASTW in previous treatment thus US and IASTW completed following minimal therex today. No complaints were provided during the entire treatment. Increased muscle tightness palpated especially along wrist flexors. Patient palpably tender along origin of the R wrist extensors. Normal US response noted following end of US session and good redness response to medial and lateral R elbow. Patient requested to leave early due to another appointment.    Rehab Potential  Fair    PT Frequency  2x / week    PT Duration  4 weeks    PT Treatment/Interventions  ADLs/Self Care Home Management;Electrical Stimulation;Cryotherapy;Ultrasound;Moist Heat;Therapeutic exercise;Therapeutic activities;Passive range of motion;Manual techniques;Patient/family education;Neuromuscular re-education    PT Next Visit Plan  Continue with ulnar nerve glides and postural strengthening per DPT POC.    Consulted and Agree with Plan of Care  Patient       Patient will benefit from skilled therapeutic intervention in order to improve the following deficits and impairments:  Pain, Impaired sensation, Impaired UE functional use, Decreased strength  Visit Diagnosis: Ulnar neuropathy at  elbow, right  Pain in right elbow     Problem  List Patient Active Problem List   Diagnosis Date Noted  . Cervical spondylosis with radiculopathy 06/15/2016    Marvell Fuller, PTA 09/22/2017, 12:37 PM  [facility] [address] [place], [place], [address] Phone: [phone]   Fax:  [phone]  Name: TRES GRZYWACZ MRN: [id number] Date of Birth: December 18, 1960

## 2017-09-26 ENCOUNTER — Ambulatory Visit: Payer: BLUE CROSS/BLUE SHIELD | Admitting: Physical Therapy

## 2017-09-26 DIAGNOSIS — G5621 Lesion of ulnar nerve, right upper limb: Secondary | ICD-10-CM | POA: Diagnosis not present

## 2017-09-26 DIAGNOSIS — M25521 Pain in right elbow: Secondary | ICD-10-CM

## 2017-09-26 DIAGNOSIS — M6281 Muscle weakness (generalized): Secondary | ICD-10-CM

## 2017-09-26 NOTE — Therapy (Signed)
[facility] [address] [place], [place], [address] Phone: [phone]   Fax:  [phone]  Physical Therapy Treatment  Patient Details  Name: TAVIEN CHESTNUT MRN: [id number] Date of Birth: 07-17-1961 Referring Provider: Coletta Memos MD   Encounter Date: 09/26/2017  PT End of Session - 09/26/17 1437    Visit Number  7    Number of Visits  8    Date for PT Re-Evaluation  10/10/17    PT Start Time  1345    PT Stop Time  1445    PT Time Calculation (min)  60 min    Activity Tolerance  Patient tolerated treatment well    Behavior During Therapy  [facility] for tasks assessed/performed       Past Medical History:  Diagnosis Date  . Afib (HCC)    pt denies this  . Anxiety   . Arthritis    neck  . 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 Constipation   . Diabetes mellitus without complication (HCC)    type 2  . Family history of adverse reaction to anesthesia    mom is allergic to anesthesia per pt  . GERD (gastroesophageal reflux disease)   . Headache    migraines   . History of kidney stones    27 stones  . Hypertension   . Kidney stones   . Neuropathy    in feet  . Pneumonia   . Ruptured disk     Past Surgical History:  Procedure Laterality Date  . BACK SURGERY     cervical spine surgery  . CARPAL TUNNEL RELEASE Right 2010  . COLONOSCOPY    . HAND SURGERY Right    removed a bone from hand  . POSTERIOR CERVICAL FUSION/FORAMINOTOMY N/A 06/15/2016   Procedure: CERVICAL THREE- FOUR CERVICAL FOUR- FIVE CERVICAL FIVE-SIX CERVICAL SIX- SEVEN POSTERIOR CERVICAL FUSION WITH LATERAL MASS FIXATION;  Surgeon: Hilda Lias, MD;  Location[facility];  Service: Neurosurgery;  Laterality: N/A;  C3-4 C4-5 C5-6 C6-7 POSTERIOR CERVICAL FUSION WITH LATERAL MASS FIXATION    There were no vitals filed for this visit.  Subjective Assessment - 09/26/17 1350    Subjective  Patient states he was in the ER this morning because he  got a sharp pain in the right side of his neck after coughing and was concerned re: a screw from fusion coming out. No results from xray at present.     Pertinent History  C3-T1 cervical fusion,     Patient Stated Goals  go all day (8 hours) without pain    Currently in Pain?  Yes    Pain Score  4     Pain Location  Elbow    Pain Orientation  Right;Medial;Lateral    Pain Descriptors / Indicators  Discomfort    Pain Type  Chronic pain                      [facility] Adult PT Treatment/Exercise - 09/26/17 0001      Shoulder Exercises: ROM/Strengthening   UBE (Upper Arm Bike)  90 RPM x8 min      Hand Exercises for Cervical Radiculopathy   Other Hand Exercise for Cervical Radiculopathy  palmer surface stretches (whole hand and individual fingers into ext)      Hand Exercises   Thumb Opposition  power web    Digit Abduction/Adduction  digit 5 isometrics 10 sec hold x 10, then  10 ABD/ADD      Modalities   Modalities  Electrical Stimulation;Ultrasound      Electrical Stimulation   Electrical Stimulation Location  Right medial elbow.Premod x 15 mins 80-150hz     Electrical Stimulation Goals  Pain      Ultrasound   Ultrasound Location  R medial upper arm and elbow    Ultrasound Parameters  1.5 w/cm2 3.3 mhz x 10 min    Ultrasound Goals  Pain      Manual Therapy   Manual Therapy  Myofascial release;Soft tissue mobilization    Soft tissue mobilization  to palmar surface of hand     Myofascial Release  IASTW to R medial elbow proximal and distal to reduce pain and muscle tightness                  PT Long Term Goals - 09/05/17 1544      PT LONG TERM GOAL #1   Title  Patient will be independent with HEP.    Time  4    Period  Weeks    Status  New    Target Date  10/03/17      PT LONG TERM GOAL #2   Title  Patient will report no neurological symptoms as an indicator of no nerve irritation.    Time  4    Period  Weeks    Status  New    Target Date   10/03/17      PT LONG TERM GOAL #3   Title  Patient will perform ADLs with pain less than 3/10.    Time  4    Period  Weeks    Status  New      PT LONG TERM GOAL #4   Title  Patient will report improved activity endurance as noted by ability to perform home and recreational activities for 8 hours with minimal rest and less than 3/10 pain.    Time  4    Period  Weeks    Status  New            Plan - 09/26/17 1438    Clinical Impression Statement  Patient did well with TE today. He responded fairly well to IASTM in upper arm along medial biceps and triceps, but did reports some lower arm pain following STW there. He reports cramping in palmar hand muscles and would benefit from more stretching to these. Normal response to modalities.    PT Treatment/Interventions  ADLs/Self Care Home Management;Electrical Stimulation;Cryotherapy;Ultrasound;Moist Heat;Therapeutic exercise;Therapeutic activities;Passive range of motion;Manual techniques;Patient/family education;Neuromuscular re-education    PT Next Visit Plan  Last visit. MD Note, assess goals.       Patient will benefit from skilled therapeutic intervention in order to improve the following deficits and impairments:  Pain, Impaired sensation, Impaired UE functional use, Decreased strength  Visit Diagnosis: Ulnar neuropathy at elbow, right  Pain in right elbow  Muscle weakness (generalized)     Problem List Patient Active Problem List   Diagnosis Date Noted  . Cervical spondylosis with radiculopathy 06/15/2016   Solon PalmJulie Kilian Schwartz PT 09/26/2017, 2:43 PM  [facility] [address] [place], [place], [address] Phone: [phone]   Fax:  [phone]  Name: Deborra MedinaRandy O Macaraeg MRN: [id number] Date of Birth: 11/17/1960

## 2017-09-29 ENCOUNTER — Encounter: Payer: Self-pay | Admitting: Physical Therapy

## 2017-09-29 ENCOUNTER — Ambulatory Visit: Payer: BLUE CROSS/BLUE SHIELD | Admitting: Physical Therapy

## 2017-09-29 DIAGNOSIS — G5621 Lesion of ulnar nerve, right upper limb: Secondary | ICD-10-CM

## 2017-09-29 DIAGNOSIS — M25521 Pain in right elbow: Secondary | ICD-10-CM

## 2017-09-29 NOTE — Therapy (Addendum)
[facility] [place], [place], [address] Phone: [phone]   Fax:  [phone]  Physical Therapy Treatment  Patient Details  Name: Jon Wong MRN: [id number] Date of Birth: 1961-04-03 Referring Provider: Ashok Pall MD   Encounter Date: 09/29/2017  PT End of Session - 09/29/17 1155    Visit Number  8    Number of Visits  8    Date for PT Re-Evaluation  10/10/17    PT Start Time  1120    PT Stop Time  1212    PT Time Calculation (min)  52 min       Past Medical History:  Diagnosis Date  . Afib ([place])    pt denies this  . Anxiety   . Arthritis    neck  . 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 Constipation   . Diabetes mellitus without complication ([place])    type 2  . Family history of adverse reaction to anesthesia    mom is allergic to anesthesia per pt  . GERD (gastroesophageal reflux disease)   . Headache    migraines   . History of kidney stones    27 stones  . Hypertension   . Kidney stones   . Neuropathy    in feet  . Pneumonia   . Ruptured disk     Past Surgical History:  Procedure Laterality Date  . BACK SURGERY     cervical spine surgery  . CARPAL TUNNEL RELEASE Right 2010  . COLONOSCOPY    . HAND SURGERY Right    removed a bone from hand  . POSTERIOR CERVICAL FUSION/FORAMINOTOMY N/A 06/15/2016   Procedure: CERVICAL THREE- FOUR CERVICAL FOUR- FIVE CERVICAL FIVE-SIX CERVICAL SIX- SEVEN POSTERIOR CERVICAL FUSION WITH LATERAL MASS FIXATION;  Surgeon: Leeroy Cha, MD;  Location: [facility];  Service: Neurosurgery;  Laterality: N/A;  C3-4 C4-5 C5-6 C6-7 POSTERIOR CERVICAL FUSION WITH LATERAL MASS FIXATION    There were no vitals filed for this visit.  Subjective Assessment - 09/29/17 1133    Subjective  I'm doing okay today.    Pain Score  4     Pain Location  Elbow    Pain Orientation  Right;Medial;Lateral    Pain Descriptors / Indicators  Discomfort    Pain Onset   More than a month ago                      [facility] Adult PT Treatment/Exercise - 09/29/17 0001      Exercises   Exercises  Elbow;Wrist;Hand      Elbow Exercises   Elbow Extension  -- 3# right Tricep kick backs 3 sets to fatigue.    Other elbow exercises  3# hammer and regular curls and ER 3 sets to fatigue.    Other elbow exercises  Wrist flexion with 3# 3 sets to fatigue.      Shoulder Exercises: ROM/Strengthening   UBE (Upper Arm Bike)  90 RPM's x 8 minutes.      Hand Exercises   Other Hand Exercises  [employer] web (right) x 3 minutes.      Wrist Exercises   Wrist Ulnar Deviation  -- 3# right ulnar deviation 3 sets to fatigue.      Acupuncturist Location  Right medial elbow.    Electrical Stimulation Action  Pre-mod.    Electrical Stimulation Parameters  80-150 Hz x 20 minutes.  Electrical Stimulation Goals  Pain                  PT Long Term Goals - 09/05/17 1544      PT LONG TERM GOAL #1   Title  Patient will be independent with HEP.    Time  4    Period  Weeks    Status  New    Target Date  10/03/17      PT LONG TERM GOAL #2   Title  Patient will report no neurological symptoms as an indicator of no nerve irritation.    Time  4    Period  Weeks    Status  New    Target Date  10/03/17      PT LONG TERM GOAL #3   Title  Patient will perform ADLs with pain less than 3/10.    Time  4    Period  Weeks    Status  New      PT LONG TERM GOAL #4   Title  Patient will report improved activity endurance as noted by ability to perform home and recreational activities for 8 hours with minimal rest and less than 3/10 pain.    Time  4    Period  Weeks    Status  New            Plan - 09/29/17 1158    Clinical Impression Statement  Patient did well today.  He reports a 4/10 pain-level.  H ehas completed 8 of 8 visits.  Patient reports a subjective improvement rating of 30 to 40%.       Patient will  benefit from skilled therapeutic intervention in order to improve the following deficits and impairments:  Pain, Impaired sensation, Impaired UE functional use, Decreased strength  Visit Diagnosis: Ulnar neuropathy at elbow, right  Pain in right elbow     Problem List Patient Active Problem List   Diagnosis Date Noted  . Cervical spondylosis with radiculopathy 06/15/2016    Jamarius Saha, [place] MPT 09/29/2017, 12:23 PM  [facility] [place], [place], [address] Phone: [phone]   Fax:  [phone]  Name: DAMONTRE MILLEA MRN: [id number] Date of Birth: 1961-06-28  PHYSICAL THERAPY DISCHARGE SUMMARY  Visits from Start of Care: 8.  Current functional level related to goals / functional outcomes: See above.   Remaining deficits: Patient pleased with progress but did not meet LTG's.   Education / Equipment: HEP. Plan: Patient agrees to discharge.  Patient goals were not met. Patient is being discharged due to being pleased with the current functional level.  ?????         [place] Tamie Minteer MPT

## 2018-01-25 ENCOUNTER — Ambulatory Visit: Payer: BLUE CROSS/BLUE SHIELD | Attending: Orthopaedic Surgery | Admitting: Physical Therapy

## 2018-01-25 ENCOUNTER — Encounter: Payer: Self-pay | Admitting: Physical Therapy

## 2018-01-25 ENCOUNTER — Other Ambulatory Visit: Payer: Self-pay

## 2018-01-25 DIAGNOSIS — M25611 Stiffness of right shoulder, not elsewhere classified: Secondary | ICD-10-CM | POA: Insufficient documentation

## 2018-01-25 DIAGNOSIS — R293 Abnormal posture: Secondary | ICD-10-CM | POA: Insufficient documentation

## 2018-01-25 DIAGNOSIS — M6281 Muscle weakness (generalized): Secondary | ICD-10-CM | POA: Diagnosis present

## 2018-01-25 DIAGNOSIS — M25511 Pain in right shoulder: Secondary | ICD-10-CM | POA: Insufficient documentation

## 2018-01-25 NOTE — Therapy (Signed)
[facility] [address] [place], [place], [address] Phone: [phone]   Fax:  [phone]  Physical Therapy Evaluation  Patient Details  Name: TABIAS SWAYZE MRN: [id number] Date of Birth: 1961/01/27 Referring Provider: Ramond Marrow MD.   Encounter Date: 01/25/2018  PT End of Session - 01/25/18 1236    Visit Number  1    Number of Visits  16    Date for PT Re-Evaluation  04/25/18    Authorization Type  FOTO AT LEAST EVERY 5TH VISIT, 10TH VISIT PROGRESS NOTE AND KX MODIFIER AFTER THE 15 VISIT.    PT Start Time  1033    PT Stop Time  1124    PT Time Calculation (min)  51 min    Activity Tolerance  Patient tolerated treatment well    Behavior During Therapy  [facility] for tasks assessed/performed       Past Medical History:  Diagnosis Date  . Afib (HCC)    pt denies this  . Anxiety   . Arthritis    neck  . 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 Constipation   . Diabetes mellitus without complication (HCC)    type 2  . Family history of adverse reaction to anesthesia    mom is allergic to anesthesia per pt  . GERD (gastroesophageal reflux disease)   . Headache    migraines   . History of kidney stones    27 stones  . Hypertension   . Kidney stones   . Neuropathy    in feet  . Pneumonia   . Ruptured disk     Past Surgical History:  Procedure Laterality Date  . BACK SURGERY     cervical spine surgery  . CARPAL TUNNEL RELEASE Right 2010  . COLONOSCOPY    . HAND SURGERY Right    removed a bone from hand  . POSTERIOR CERVICAL FUSION/FORAMINOTOMY N/A 06/15/2016   Procedure: CERVICAL THREE- FOUR CERVICAL FOUR- FIVE CERVICAL FIVE-SIX CERVICAL SIX- SEVEN POSTERIOR CERVICAL FUSION WITH LATERAL MASS FIXATION;  Surgeon: Hilda Lias, MD;  [facility];  Service: Neurosurgery;  Laterality: N/A;  C3-4 C4-5 C5-6 C6-7 POSTERIOR CERVICAL FUSION WITH LATERAL MASS FIXATION    There were no vitals filed for this  visit.   Subjective Assessment - 01/25/18 1243    Subjective  The patient reports that on 12/08/17 he was mowing his lawn.  He pushed up on a limb with his right UE while pushing the mower with his left hand.  The limb would not move resulting in the patients right shoulder being forced overhead.  He states he felt something "pop" and then immediate pain.  He reports anterior right shoulder pain and something like a "hot poker" on the side of hos shoulder when he lies on his right shoulder.  Rest and Advil decrease his pain.  Reaching increases his pain.    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    Patient Stated Goals  Use right UE without pain.    Currently in Pain?  Yes    Pain Score  7     Pain Location  Shoulder    Pain Orientation  Right    Pain Descriptors / Indicators  Aching;Sharp    Pain Type  Acute pain    Pain Onset  More than a month ago    Pain Frequency  Constant    Aggravating Factors  See above.    Pain Relieving Factors  See above.         [facility] PT Assessment - 01/25/18 0001      Assessment   Medical Diagnosis  Right RTC syndrome.    Referring Provider  Ramond Marrow MD.    Onset Date/Surgical Date  -- 12/08/17.      Precautions   Precautions  None      Restrictions   Weight Bearing Restrictions  No      Balance Screen   Has the patient fallen in the past 6 months  No    Has the patient had a decrease in activity level because of a fear of falling?   No    Is the patient reluctant to leave their home because of a fear of falling?   No      Home Environment   Living Environment  Private residence      Observation/Other Assessments   Focus on Therapeutic Outcomes (FOTO)   58% limitation.      ROM / Strength   AROM / PROM / Strength  AROM;Strength      AROM   Overall AROM Comments  Right shoulder flexion against gravity= 105 degrees; ER to 75 degrees and behind back to right SIJ.      Strength   Overall Strength  Comments  Right shoulder ER= 4-/5; IR= 4+/5; deltoids= 4/5.      Palpation   Palpation comment  Very tender to palpation over right bicipital groove and what appears to be referred pain into the right middle deltoid region.      Special Tests    Special Tests  Rotator Cuff Impingement    Rotator Cuff Impingment tests  Neer impingement test;Hawkins- Kyung Rudd test;Empty Can test      Neer Impingement test    Findings  Positive    Side  Right      Hawkins-Kennedy test   Findings  Positive    Side  Right      Empty Can test   Findings  Negative    Side  Right                Objective measurements completed on examination: See above findings.              PT Education - 01/25/18 1238    Education Details  Right shoulder wall climbs.    Person(s) Educated  Patient    Methods  Explanation;Demonstration    Comprehension  Verbalized understanding       PT Short Term Goals - 01/25/18 1257      PT SHORT TERM GOAL #1   Title  STG's=LTG's.        PT Long Term Goals - 01/25/18 1258      PT LONG TERM GOAL #1   Title  Patient will be independent with HEP.    Time  8    Period  Weeks    Status  New      PT LONG TERM GOAL #2   Title  Active right shoulder flexion to 145 degrees so the patient can easily reach overhead.    Time  8    Period  Weeks    Status  New      PT LONG TERM GOAL #3   Title  Active ER to 70 degrees+ to allow for easily donning/doffing of apparel.    Time  8    Period  Weeks    Status  New      PT LONG TERM GOAL #4   Title  Increase ROM so patient is able to reach behind back to L3.    Time  8    Period  Weeks    Status  New      PT LONG TERM GOAL #5   Title  Increase shoulder strength to a solid 4+/5 to increase stability for performance of functional activities.    Time  8    Period  Weeks    Status  New      PT LONG TERM GOAL #6   Title  Perform ADL's with pain not > 2-3/10.    Time  8    Period  Weeks    Status  New              Plan - 01/25/18 1254    Clinical Impression Statement  The patient presents to OPPT with c/o right shoulder pain due to an injury on 12/08/17.  She has limited flexion and behind the back movement.  He also had a positive Impingement test.  Due to pain and deficits he has lost fucntional use of his right shoulder.  Patient will benefit from skilled physical therapy intervention to address pain and deficits.       Patient will benefit from skilled therapeutic intervention in order to improve the following deficits and impairments:     Visit Diagnosis: Acute pain of right shoulder - Plan: PT plan of care cert/re-cert  Stiffness of right shoulder, not elsewhere classified - Plan: PT plan of care cert/re-cert     Problem List Patient Active Problem List   Diagnosis Date Noted  . Cervical spondylosis with radiculopathy 06/15/2016    Avangelina Flight, [place] MPT 01/25/2018, 2:03 PM  [facility] [address] [place], [place], [address] Phone: [phone]   Fax:  [phone]  Name: Deborra MedinaRandy O Thomley MRN: [id number] Date of Birth: 03/16/1961

## 2018-01-29 ENCOUNTER — Ambulatory Visit: Payer: BLUE CROSS/BLUE SHIELD | Admitting: Physical Therapy

## 2018-01-29 ENCOUNTER — Encounter: Payer: Self-pay | Admitting: Physical Therapy

## 2018-01-29 DIAGNOSIS — M25511 Pain in right shoulder: Secondary | ICD-10-CM | POA: Diagnosis not present

## 2018-01-29 DIAGNOSIS — M6281 Muscle weakness (generalized): Secondary | ICD-10-CM

## 2018-01-29 DIAGNOSIS — M25611 Stiffness of right shoulder, not elsewhere classified: Secondary | ICD-10-CM

## 2018-01-29 DIAGNOSIS — R293 Abnormal posture: Secondary | ICD-10-CM

## 2018-01-29 NOTE — Therapy (Signed)
[facility] [address] [place], [place], [address] Phone: [phone]   Fax:  [phone]  Physical Therapy Treatment  Patient Details  Name: TARAS RASK MRN: [id number] Date of Birth: 14-Sep-1960 Referring Provider: Ramond Marrow MD.   Encounter Date: 01/29/2018  PT End of Session - 01/29/18 1128    Visit Number  2    Number of Visits  16    Date for PT Re-Evaluation  04/25/18    Authorization Type  FOTO AT LEAST EVERY 5TH VISIT, 10TH VISIT PROGRESS NOTE AND KX MODIFIER AFTER THE 15 VISIT.    PT Start Time  1030    PT Stop Time  1120    PT Time Calculation (min)  50 min    Activity Tolerance  Patient tolerated treatment well    Behavior During Therapy  WFL for tasks assessed/performed       Past Medical History:  Diagnosis Date  . Afib (HCC)    pt denies this  . Anxiety   . Arthritis    neck  . 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 Constipation   . Diabetes mellitus without complication (HCC)    type 2  . Family history of adverse reaction to anesthesia    mom is allergic to anesthesia per pt  . GERD (gastroesophageal reflux disease)   . Headache    migraines   . History of kidney stones    27 stones  . Hypertension   . Kidney stones   . Neuropathy    in feet  . Pneumonia   . Ruptured disk     Past Surgical History:  Procedure Laterality Date  . BACK SURGERY     cervical spine surgery  . CARPAL TUNNEL RELEASE Right 2010  . COLONOSCOPY    . HAND SURGERY Right    removed a bone from hand  . POSTERIOR CERVICAL FUSION/FORAMINOTOMY N/A 06/15/2016   Procedure: CERVICAL THREE- FOUR CERVICAL FOUR- FIVE CERVICAL FIVE-SIX CERVICAL SIX- SEVEN POSTERIOR CERVICAL FUSION WITH LATERAL MASS FIXATION;  Surgeon: Hilda Lias, MD;  Location: [facility] OR;  Service: Neurosurgery;  Laterality: N/A;  C3-4 C4-5 C5-6 C6-7 POSTERIOR CERVICAL FUSION WITH LATERAL MASS FIXATION    There were no vitals filed for this  visit.  Subjective Assessment - 01/29/18 1051    Subjective  Pt arriving to therapy reporting 2/10 pain in his R shoulder.     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    Patient Stated Goals  Use right UE without pain.    Currently in Pain?  Yes    Pain Score  2     Pain Location  Shoulder    Pain Orientation  Right    Pain Descriptors / Indicators  Aching    Pain Type  Acute pain    Pain Onset  More than a month ago    Pain Frequency  Constant    Aggravating Factors   reaching, some ADL's    Pain Relieving Factors  Advil, heating, TENS unit, BIofreeze                       [facility] Adult PT Treatment/Exercise - 01/29/18 0001      Exercises   Exercises  Shoulder      Shoulder Exercises: Supine   External Rotation  AAROM;10 reps    Flexion  AAROM;10 reps  Shoulder Exercises: Pulleys   Flexion  3 minutes      Shoulder Exercises: ROM/Strengthening   UBE (Upper Arm Bike)  forward/ 2 minutes backward    Ranger  circles clockwise and counter clockwise x 20      Shoulder Exercises: Isometric Strengthening   Flexion  5X5"    Flexion Limitations  2 sets    Extension  5X5"    Extension Limitations  2 sets    External Rotation  5X5"    External Rotation Limitations  2 sets    Internal Rotation  5X5"    Internal Rotation Limitations  2 sets      Modalities   Modalities  Electrical Stimulation;Moist Heat      Moist Heat Therapy   Number Minutes Moist Heat  15 Minutes    Moist Heat Location  Shoulder      Electrical Stimulation   Electrical Stimulation Location  R shoulder    Electrical Stimulation Action  IFC     Electrical Stimulation Parameters  80-150 Hz x 15 minutes, intensity to tolerance    Electrical Stimulation Goals  Pain      Manual Therapy   Manual Therapy  Joint mobilization;Soft tissue mobilization;Passive ROM    Joint Mobilization  grade 2-3 joint mobs to R shoulder    Soft tissue mobilization   STW performed to bicep tendon, anterior and lateral shoulder    Passive ROM  flexion, ER             PT Education - 01/29/18 1125    Education Details  isometrics, table slides    Person(s) Educated  Patient    Methods  Explanation;Demonstration;Handout    Comprehension  Verbalized understanding;Returned demonstration       PT Short Term Goals - 01/25/18 1257      PT SHORT TERM GOAL #1   Title  STG's=LTG's.        PT Long Term Goals - 01/29/18 1148      PT LONG TERM GOAL #1   Period  Weeks    Status  New      PT LONG TERM GOAL #2   Title  Active right shoulder flexion to 145 degrees so the patient can easily reach overhead.    Time  8    Period  Weeks    Status  New      PT LONG TERM GOAL #3   Title  Active ER to 70 degrees+ to allow for easily donning/doffing of apparel.    Time  8    Period  Weeks      PT LONG TERM GOAL #4   Title  Increase ROM so patient is able to reach behind back to L3.    Time  8    Period  Weeks    Status  New      PT LONG TERM GOAL #5   Title  Increase shoulder strength to a solid 4+/5 to increase stability for performance of functional activities.    Time  8    Period  Weeks    Status  New      PT LONG TERM GOAL #6   Title  Perform ADL's with pain not > 2-3/10.    Time  8    Period  Weeks    Status  New            Plan - 01/29/18 1129    Clinical Impression Statement  Pt tolerating all exercises well  today, pt instructed in HEP, Pt brought in TENS unit from home and instructed in use and settings. Pt issued Handout for home exercises, STW, joint mobs, PROM and E-stim performed with moist heat. Pt reporting increased pain at end of sesion due to increased movemnets throughout session. Pt reported he would go home and Ice and use Biofreeze.      History and Personal Factors relevant to plan of care:  Cervical fusion, CTS, DM, R ulnar nerve injury    Clinical Presentation  Stable    Rehab Potential  Good    Clinical  Impairments Affecting Rehab Potential  --    PT Treatment/Interventions  ADLs/Self Care Home Management;Moist Heat;Electrical Stimulation;Cryotherapy;Ultrasound;Functional mobility training;Therapeutic activities;Therapeutic exercise;Passive range of motion;Manual techniques;Patient/family education    PT Next Visit Plan  shoulder ROM, gentle strengtheing, STW, modalities as needed    PT Home Exercise Plan  wall slides, table slides, shoulder 4 way isometrics    Consulted and Agree with Plan of Care  Patient       Patient will benefit from skilled therapeutic intervention in order to improve the following deficits and impairments:  Pain, Postural dysfunction, Impaired UE functional use, Decreased strength  Visit Diagnosis: Acute pain of right shoulder  Stiffness of right shoulder, not elsewhere classified  Muscle weakness (generalized)  Abnormal posture     Problem List Patient Active Problem List   Diagnosis Date Noted  . Cervical spondylosis with radiculopathy 06/15/2016    Sharmon LeydenJennifer R Gwynne Kemnitz, MPT 01/29/2018, 12:01 PM  [facility] [address] [place], [place], [address] Phone: [phone]   Fax:  [phone]  Name: Deborra MedinaRandy O [place] MRN: [id number] Date of Birth: 06/01/1961

## 2018-02-01 ENCOUNTER — Encounter: Payer: Self-pay | Admitting: Physical Therapy

## 2018-02-01 ENCOUNTER — Ambulatory Visit: Payer: BLUE CROSS/BLUE SHIELD | Admitting: Physical Therapy

## 2018-02-01 DIAGNOSIS — M25611 Stiffness of right shoulder, not elsewhere classified: Secondary | ICD-10-CM

## 2018-02-01 DIAGNOSIS — M25511 Pain in right shoulder: Secondary | ICD-10-CM | POA: Diagnosis not present

## 2018-02-01 NOTE — Therapy (Signed)
[facility] [address] [place], [place], [address] Phone: [phone]   Fax:  [phone]  Physical Therapy Treatment  Patient Details  Name: Jon MedinaRandy O Wong MRN: [id number] Date of Birth: 09/18/1960 Referring Provider: Ramond Marrowax Varkey MD.   Encounter Date: 02/01/2018  PT End of Session - 02/01/18 1017    Visit Number  3    Number of Visits  16    Date for PT Re-Evaluation  04/25/18    Authorization Type  FOTO AT LEAST EVERY 5TH VISIT, 10TH VISIT PROGRESS NOTE AND KX MODIFIER AFTER THE 15 VISIT.    PT Start Time  [phone]    PT Stop Time  1033    PT Time Calculation (min)  47 min    Activity Tolerance  Patient tolerated treatment well;Patient limited by pain    Behavior During Therapy  [facility] for tasks assessed/performed       Past Medical History:  Diagnosis Date  . Afib (HCC)    pt denies this  . Anxiety   . Arthritis    neck  . 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 Constipation   . Diabetes mellitus without complication (HCC)    type 2  . Family history of adverse reaction to anesthesia    mom is allergic to anesthesia per pt  . GERD (gastroesophageal reflux disease)   . Headache    migraines   . History of kidney stones    27 stones  . Hypertension   . Kidney stones   . Neuropathy    in feet  . Pneumonia   . Ruptured disk     Past Surgical History:  Procedure Laterality Date  . BACK SURGERY     cervical spine surgery  . CARPAL TUNNEL RELEASE Right 2010  . COLONOSCOPY    . HAND SURGERY Right    removed a bone from hand  . POSTERIOR CERVICAL FUSION/FORAMINOTOMY N/A 06/15/2016   Procedure: CERVICAL THREE- FOUR CERVICAL FOUR- FIVE CERVICAL FIVE-SIX CERVICAL SIX- SEVEN POSTERIOR CERVICAL FUSION WITH LATERAL MASS FIXATION;  Surgeon: Hilda LiasErnesto Botero, MD;  Location: [facility] OR;  Service: Neurosurgery;  Laterality: N/A;  C3-4 C4-5 C5-6 C6-7 POSTERIOR CERVICAL FUSION WITH LATERAL MASS FIXATION    There were no  vitals filed for this visit.  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    Patient Stated Goals  Use right UE without pain.    Currently in Pain?  Yes    Pain Score  6     Pain Location  Shoulder    Pain Orientation  Right;Anterior    Pain Descriptors / Indicators  Aching;Discomfort;Sore    Pain Type  Acute pain    Pain Onset  More than a month ago    Pain Frequency  Constant    Aggravating Factors   increased activity    Pain Relieving Factors  meds and rest                       [facility] Adult PT Treatment/Exercise - 02/01/18 0001      Shoulder Exercises: Supine   Other Supine Exercises  supine cane for chest press and flexion elbows bent 2x10 each      Modalities   Modalities  Ultrasound;Electrical Stimulation;Moist Heat  Moist Heat Therapy   Number Minutes Moist Heat  15 Minutes    Moist Heat Location  Shoulder      Electrical Stimulation   Electrical Stimulation Location  R shoulder    Electrical Stimulation Action  IFC    Electrical Stimulation Parameters  80-150hz  x36min    Electrical Stimulation Goals  Pain      Ultrasound   Ultrasound Location  Rt ant shldr    Ultrasound Parameters  1.5w/cm2/50%/61mhz x66min    Ultrasound Goals  Pain      Manual Therapy   Manual Therapy  Passive ROM    Manual therapy comments  manual gentle PROM for flexion/IR/ER, then rhythmic stabs for flex/ext at 90 and IR/ER in scaption               PT Short Term Goals - 01/25/18 1257      PT SHORT TERM GOAL #1   Title  STG's=LTG's.        PT Long Term Goals - 01/29/18 1148      PT LONG TERM GOAL #1   Title  Patient will be independent with HEP.    Time  8    Period  Weeks    Status  New      PT LONG TERM GOAL #2   Title  Active right shoulder flexion to 145 degrees so the patient can easily reach  overhead.    Time  8    Period  Weeks    Status  New      PT LONG TERM GOAL #3   Title  Active ER to 70 degrees+ to allow for easily donning/doffing of apparel.    Time  8    Period  Weeks      PT LONG TERM GOAL #4   Title  Increase ROM so patient is able to reach behind back to L3.    Time  8    Period  Weeks    Status  New      PT LONG TERM GOAL #5   Title  Increase shoulder strength to a solid 4+/5 to increase stability for performance of functional activities.    Time  8    Period  Weeks    Status  New      PT LONG TERM GOAL #6   Title  Perform ADL's with pain not > 2-3/10.    Time  8    Period  Weeks    Status  New            Plan - 02/01/18 1020    Clinical Impression Statement  Patient arrived with increased discomfort. Patient limited with exercises today. Patient tolerated treatment well today. Focused on gentle ROM and AAROM supine/rhythmic stabs and modalities today to help keep right shoulder motion functional and to decrease pain. Patient motion improving passively today. Patient may be able to increse exercise next week as symptoms dictate. Goals ongoing.     Rehab Potential  Good    PT Treatment/Interventions  ADLs/Self Care Home Management;Moist Heat;Electrical Stimulation;Cryotherapy;Ultrasound;Functional mobility training;Therapeutic activities;Therapeutic exercise;Passive range of motion;Manual techniques;Patient/family education    PT Next Visit Plan  shoulder ROM, gentle strengtheing, STW, modalities as needed    Consulted and Agree with Plan of Care  Patient       Patient will benefit from skilled therapeutic intervention in order to improve the following deficits and impairments:  Pain, Postural dysfunction, Impaired UE functional use, Decreased strength  Visit Diagnosis: Acute  pain of right shoulder  Stiffness of right shoulder, not elsewhere classified     Problem List Patient Active Problem List   Diagnosis Date Noted  . Cervical  spondylosis with radiculopathy 06/15/2016    Roald Lukacs P, PTA 02/01/2018, 10:33 AM  [facility] [address] [place], [place], [address] Phone: [phone]   Fax:  [phone]  Name: Jon Wong MRN: [id number] Date of Birth: 04/18/1961

## 2018-02-05 ENCOUNTER — Encounter: Payer: Self-pay | Admitting: Physical Therapy

## 2018-02-05 ENCOUNTER — Ambulatory Visit: Payer: BLUE CROSS/BLUE SHIELD | Admitting: Physical Therapy

## 2018-02-05 DIAGNOSIS — M6281 Muscle weakness (generalized): Secondary | ICD-10-CM

## 2018-02-05 DIAGNOSIS — M25511 Pain in right shoulder: Secondary | ICD-10-CM

## 2018-02-05 DIAGNOSIS — M25611 Stiffness of right shoulder, not elsewhere classified: Secondary | ICD-10-CM

## 2018-02-05 NOTE — Therapy (Signed)
[facility] [address] [place], [place], [address] Phone: [phone]   Fax:  [phone]  Physical Therapy Treatment  Patient Details  Name: Jon Wong MRN: [id number] Date of Birth: [birth year] Referring Provider: Ramond Marrow MD.   Encounter Date: 02/05/2018  PT End of Session - 02/05/18 1150    Visit Number  4    Number of Visits  16    Date for PT Re-Evaluation  04/25/18    Authorization Type  FOTO AT LEAST EVERY 5TH VISIT, 10TH VISIT PROGRESS NOTE AND KX MODIFIER AFTER THE 15 VISIT.    PT Start Time  1116    PT Stop Time  1204    PT Time Calculation (min)  48 min    Activity Tolerance  Patient tolerated treatment well;Patient limited by pain    Behavior During Therapy  WFL for tasks assessed/performed       Past Medical History:  Diagnosis Date  . Afib (HCC)    pt denies this  . Anxiety   . Arthritis    neck  . 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 Constipation   . Diabetes mellitus without complication (HCC)    type 2  . Family history of adverse reaction to anesthesia    mom is allergic to anesthesia per pt  . GERD (gastroesophageal reflux disease)   . Headache    migraines   . History of kidney stones    27 stones  . Hypertension   . Kidney stones   . Neuropathy    in feet  . Pneumonia   . Ruptured disk     Past Surgical History:  Procedure Laterality Date  . BACK SURGERY     cervical spine surgery  . CARPAL TUNNEL RELEASE Right 2010  . COLONOSCOPY    . HAND SURGERY Right    removed a bone from hand  . POSTERIOR CERVICAL FUSION/FORAMINOTOMY N/A 06/15/2016   Procedure: CERVICAL THREE- FOUR CERVICAL FOUR- FIVE CERVICAL FIVE-SIX CERVICAL SIX- SEVEN POSTERIOR CERVICAL FUSION WITH LATERAL MASS FIXATION;  Surgeon: Hilda Lias, MD;  Location: [facility] OR;  Service: Neurosurgery;  Laterality: N/A;  C3-4 C4-5 C5-6 C6-7 POSTERIOR CERVICAL FUSION WITH LATERAL MASS FIXATION    There were no  vitals filed for this visit.  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    Patient Stated Goals  Use right UE without pain.    Currently in Pain?  Yes    Pain Score  2     Pain Location  Shoulder    Pain Orientation  Right;Anterior    Pain Descriptors / Indicators  Aching;Discomfort    Pain Type  Acute pain    Pain Onset  More than a month ago    Pain Frequency  Constant    Aggravating Factors   use of shoulder    Pain Relieving Factors  rest                       [facility] Adult PT Treatment/Exercise - 02/05/18 0001      Shoulder Exercises: Supine   Other Supine Exercises  supine cane for ER/chest  press and flexion elbows bent 2x10 each      Moist Heat Therapy   Number Minutes Moist Heat  15 Minutes    Moist Heat Location  Shoulder      Electrical Stimulation   Electrical Stimulation Location  R shoulder    Electrical Stimulation Action  IFC    Electrical Stimulation Parameters  80-150HZ  x15 min    Electrical Stimulation Goals  Pain      Ultrasound   Ultrasound Location  Rt ant shoulder    Ultrasound Parameters  1.5w/cm2/50%/41mhz x67min    Ultrasound Goals  Pain      Manual Therapy   Manual Therapy  Passive ROM    Manual therapy comments  manual gentle PROM for flexion/IR/ER, then rhythmic stabs for flex/ext at 90 and IR/ER in scaption               PT Short Term Goals - 01/25/18 1257      PT SHORT TERM GOAL #1   Title  STG's=LTG's.        PT Long Term Goals - 01/29/18 1148      PT LONG TERM GOAL #1   Title  Patient will be independent with HEP.    Time  8    Period  Weeks    Status  New      PT LONG TERM GOAL #2   Title  Active right shoulder  flexion to 145 degrees so the patient can easily reach overhead.    Time  8    Period  Weeks    Status  New      PT LONG TERM GOAL #3   Title  Active ER to 70 degrees+ to allow for easily donning/doffing of apparel.    Time  8    Period  Weeks      PT LONG TERM GOAL #4   Title  Increase ROM so patient is able to reach behind back to L3.    Time  8    Period  Weeks    Status  New      PT LONG TERM GOAL #5   Title  Increase shoulder strength to a solid 4+/5 to increase stability for performance of functional activities.    Time  8    Period  Weeks    Status  New      PT LONG TERM GOAL #6   Title  Perform ADL's with pain not > 2-3/10.    Time  8    Period  Weeks    Status  New            Plan - 02/05/18 1151    Clinical Impression Statement  Patient tolerated treatment well today. Patient has reported using his right UE for daily tasks and lifting and pulling causeing pain. Today focused on gentle ROM/AAROM with cane and rhythmic stabs/modalities to help reduce discomfort. Patient may be able to increase exercises next visit as symptoms dictate. Goals ongoing.     Rehab Potential  Good    PT Treatment/Interventions  ADLs/Self Care Home Management;Moist Heat;Electrical Stimulation;Cryotherapy;Ultrasound;Functional mobility training;Therapeutic activities;Therapeutic exercise;Passive range of motion;Manual techniques;Patient/family education    PT Next Visit Plan  shoulder ROM, gentle strengtheing, STW, modalities as needed    Consulted and Agree with Plan of Care  Patient       Patient will benefit from skilled therapeutic intervention in order to improve the following deficits and impairments:  Pain, Postural dysfunction, Impaired UE functional use,  Decreased strength  Visit Diagnosis: Acute pain of right shoulder  Stiffness of right shoulder, not elsewhere classified  Muscle weakness (generalized)     Problem List Patient Active Problem List   Diagnosis Date  Noted  . Cervical spondylosis with radiculopathy 06/15/2016    Hermelinda DellenDUNFORD, Montavis Schubring P, PTA 02/05/2018, 12:05 PM  [facility] [address] [place], [place], [address] Phone: [phone]   Fax:  [phone]  Name: Jon Wong MRN: [id number] Date of Birth: 01/21/1961

## 2018-02-08 ENCOUNTER — Encounter: Payer: Self-pay | Admitting: Physical Therapy

## 2018-02-08 ENCOUNTER — Ambulatory Visit: Payer: BLUE CROSS/BLUE SHIELD | Admitting: Physical Therapy

## 2018-02-08 DIAGNOSIS — M25611 Stiffness of right shoulder, not elsewhere classified: Secondary | ICD-10-CM

## 2018-02-08 DIAGNOSIS — M25511 Pain in right shoulder: Secondary | ICD-10-CM | POA: Diagnosis not present

## 2018-02-08 DIAGNOSIS — M6281 Muscle weakness (generalized): Secondary | ICD-10-CM

## 2018-02-08 NOTE — Therapy (Signed)
[facility] [address] [place], [place], [address] Phone: [phone]   Fax:  [phone]  Physical Therapy Treatment  Patient Details  Name: Jon Wong MRN: [id number] Date of Birth: 09/15/1960 Referring Provider: Ramond Marrow MD.   Encounter Date: 02/08/2018  PT End of Session - 02/08/18 1106    Visit Number  5    Number of Visits  16    Date for PT Re-Evaluation  04/25/18    Authorization Type  FOTO AT LEAST EVERY 5TH VISIT, 10TH VISIT PROGRESS NOTE AND KX MODIFIER AFTER THE 15 VISIT.    PT Start Time  1030    PT Stop Time  1119    PT Time Calculation (min)  49 min    Activity Tolerance  Patient tolerated treatment well    Behavior During Therapy  WFL for tasks assessed/performed       Past Medical History:  Diagnosis Date  . Afib (HCC)    pt denies this  . Anxiety   . Arthritis    neck  . 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 Constipation   . Diabetes mellitus without complication (HCC)    type 2  . Family history of adverse reaction to anesthesia    mom is allergic to anesthesia per pt  . GERD (gastroesophageal reflux disease)   . Headache    migraines   . History of kidney stones    27 stones  . Hypertension   . Kidney stones   . Neuropathy    in feet  . Pneumonia   . Ruptured disk     Past Surgical History:  Procedure Laterality Date  . BACK SURGERY     cervical spine surgery  . CARPAL TUNNEL RELEASE Right 2010  . COLONOSCOPY    . HAND SURGERY Right    removed a bone from hand  . POSTERIOR CERVICAL FUSION/FORAMINOTOMY N/A 06/15/2016   Procedure: CERVICAL THREE- FOUR CERVICAL FOUR- FIVE CERVICAL FIVE-SIX CERVICAL SIX- SEVEN POSTERIOR CERVICAL FUSION WITH LATERAL MASS FIXATION;  Surgeon: Hilda Lias, MD;  Location: [facility];  Service: Neurosurgery;  Laterality: N/A;  C3-4 C4-5 C5-6 C6-7 POSTERIOR CERVICAL FUSION WITH LATERAL MASS FIXATION    There were no vitals filed for this  visit.  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    Patient Stated Goals  Use right UE without pain.    Currently in Pain?  Yes    Pain Score  2     Pain Location  Shoulder    Pain Orientation  Right;Anterior    Pain Descriptors / Indicators  Aching;Discomfort    Pain Type  Acute pain    Pain Onset  More than a month ago    Pain Frequency  Intermittent    Aggravating Factors   use of shoulder    Pain Relieving Factors  rest                       [facility] Adult PT Treatment/Exercise - 02/08/18 0001      Shoulder Exercises: Pulleys   Flexion  5 minutes      Shoulder Exercises: ROM/Strengthening   Wall Pushups  20 reps      Moist Heat Therapy   Number Minutes Moist Heat  15 Minutes  Moist Heat Location  Shoulder      Electrical Stimulation   Electrical Stimulation Location  R shoulder    Electrical Stimulation Action  IFC    Electrical Stimulation Parameters  80-150 Hz x3915min    Electrical Stimulation Goals  Pain      Ultrasound   Ultrasound Location  Rt ant shoulder    Ultrasound Parameters  1.5w/cm2/50%/201mhz x438min    Ultrasound Goals  Pain      Manual Therapy   Manual Therapy  Passive ROM    Manual therapy comments  manual gentle PROM for flexion/IR/ER, then rhythmic stabs for flex/ext at 90 and IR/ER in scaption               PT Short Term Goals - 01/25/18 1257      PT SHORT TERM GOAL #1   Title  STG's=LTG's.        PT Long Term Goals - 02/08/18 1137      PT LONG TERM GOAL #1   Title  Patient will be independent with HEP.    Time  8    Period  Weeks    Status  On-going      PT LONG TERM GOAL #2   Title  Active right shoulder flexion to 145 degrees so the patient can easily reach overhead.    Time  8    Period  Weeks    Status  On-going      PT LONG TERM GOAL #3   Title  Active ER to  70 degrees+ to allow for easily donning/doffing of apparel.    Time  8    Period  Weeks    Status  On-going      PT LONG TERM GOAL #4   Title  Increase ROM so patient is able to reach behind back to L3.    Time  8    Period  Weeks    Status  On-going      PT LONG TERM GOAL #5   Title  Increase shoulder strength to a solid 4+/5 to increase stability for performance of functional activities.    Time  8    Period  Weeks    Status  On-going      PT LONG TERM GOAL #6   Title  Perform ADL's with pain not > 2-3/10.    Time  8    Period  Weeks    Status  On-going            Plan - 02/08/18 1111    Clinical Impression Statement  Patient tolerated treatment well today. Patient has felt relief today and has improved since day one. Patient has decreased pain and improved ROM this week. Patient able to progress right shoulder exercises slowly. Today focused on gentle ROM and stabilizing shoulder. Patient improved FOTO score today. Goals ongoing.     Rehab Potential  Good    Clinical Impairments Affecting Rehab Potential  FOTO initial limitation 58% current 5th visit 37%    PT Treatment/Interventions  ADLs/Self Care Home Management;Moist Heat;Electrical Stimulation;Cryotherapy;Ultrasound;Functional mobility training;Therapeutic activities;Therapeutic exercise;Passive range of motion;Manual techniques;Patient/family education    PT Next Visit Plan  cont with POC for gentle shoulder ROM, gentle strengtheing/stabilization, STW, modalities as needed    Consulted and Agree with Plan of Care  Patient       Patient will benefit from skilled therapeutic intervention in order to improve the following deficits and impairments:  Pain, Postural dysfunction, Impaired UE functional use,  Decreased strength  Visit Diagnosis: Acute pain of right shoulder  Stiffness of right shoulder, not elsewhere classified  Muscle weakness (generalized)     Problem List Patient Active Problem List    Diagnosis Date Noted  . Cervical spondylosis with radiculopathy 06/15/2016    Nawal Burling P, PTA 02/08/2018, 11:38 AM  [facility] [address] [place], [place], [address] Phone: [phone]   Fax:  [phone]  Name: Jon Wong MRN: [id number] Date of Birth: [birth year]

## 2018-02-13 ENCOUNTER — Ambulatory Visit: Payer: BLUE CROSS/BLUE SHIELD | Admitting: Physical Therapy

## 2018-02-13 DIAGNOSIS — M25611 Stiffness of right shoulder, not elsewhere classified: Secondary | ICD-10-CM

## 2018-02-13 DIAGNOSIS — M25511 Pain in right shoulder: Secondary | ICD-10-CM

## 2018-02-13 NOTE — Therapy (Signed)
[facility] [address] [place], [place], [address] Phone: [phone]   Fax:  [phone]  Physical Therapy Treatment  Patient Details  Name: Jon MedinaRandy O Wong MRN: [id number] Date of Birth: 11/13/1960 Referring Provider: Ramond Marrowax Varkey MD.   Encounter Date: 02/13/2018  PT End of Session - 02/13/18 1224    Visit Number  6    Number of Visits  16    Date for PT Re-Evaluation  04/25/18    Authorization Type  FOTO AT LEAST EVERY 5TH VISIT, 10TH VISIT PROGRESS NOTE AND KX MODIFIER AFTER THE 15 VISIT.    PT Start Time  1110    PT Stop Time  1206    PT Time Calculation (min)  56 min    Activity Tolerance  Patient tolerated treatment well    Behavior During Therapy  WFL for tasks assessed/performed       Past Medical History:  Diagnosis Date  . Afib (HCC)    pt denies this  . Anxiety   . Arthritis    neck  . 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 Constipation   . Diabetes mellitus without complication (HCC)    type 2  . Family history of adverse reaction to anesthesia    mom is allergic to anesthesia per pt  . GERD (gastroesophageal reflux disease)   . Headache    migraines   . History of kidney stones    27 stones  . Hypertension   . Kidney stones   . Neuropathy    in feet  . Pneumonia   . Ruptured disk     Past Surgical History:  Procedure Laterality Date  . BACK SURGERY     cervical spine surgery  . CARPAL TUNNEL RELEASE Right 2010  . COLONOSCOPY    . HAND SURGERY Right    removed a bone from hand  . POSTERIOR CERVICAL FUSION/FORAMINOTOMY N/A 06/15/2016   Procedure: CERVICAL THREE- FOUR CERVICAL FOUR- FIVE CERVICAL FIVE-SIX CERVICAL SIX- SEVEN POSTERIOR CERVICAL FUSION WITH LATERAL MASS FIXATION;  Surgeon: Hilda LiasErnesto Botero, MD;  Location[facility];  Service: Neurosurgery;  Laterality: N/A;  C3-4 C4-5 C5-6 C6-7 POSTERIOR CERVICAL FUSION WITH LATERAL MASS FIXATION    There were no vitals filed for this  visit.  Subjective Assessment - 02/13/18 1225    Subjective  Patient presents to the clinic today with sling donned.  He states he pulled a cucumber from the ground with his right hand and then threw it rapidly which produced severe right shoulder pain.    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    Patient Stated Goals  Use right UE without pain.    Currently in Pain?  Yes    Pain Score  6     Pain Location  Shoulder    Pain Descriptors / Indicators  Aching;Discomfort    Pain Onset  More than a month ago                       [facility] - 02/13/18 0001      Modalities   Modalities  Electrical Stimulation;Moist Heat;Ultrasound      Moist Heat Therapy   Number Minutes Moist Heat  20 Minutes    Moist Heat Location  -- Right shoulder.      Programme researcher, broadcasting/film/videolectrical Stimulation   Electrical Stimulation Location  Right shoulder.    Statisticianlectrical Stimulation  Action  IFC    Electrical Stimulation Parameters  80-150 Hz x 20 minutes.    Electrical Stimulation Goals  Pain      Ultrasound   Ultrasound Location  Right anterior shoulder.    Ultrasound Parameters  Combo e'stim/U/S at 1.40 W/CM2 x 12 minutes.    Ultrasound Goals  Pain      Manual Therapy   Soft tissue mobilization  IASTM x 12 minutes to patients right anterior shoulder region.               PT Short Term Goals - 01/25/18 1257      PT SHORT TERM GOAL #1   Title  STG's=LTG's.        PT Long Term Goals - 02/08/18 1137      PT LONG TERM GOAL #1   Title  Patient will be independent with HEP.    Time  8    Period  Weeks    Status  On-going      PT LONG TERM GOAL #2   Title  Active right shoulder flexion to 145 degrees so the patient can easily reach overhead.    Time  8    Period  Weeks    Status  On-going      PT LONG TERM GOAL #3   Title  Active ER to 70 degrees+ to allow for easily donning/doffing of apparel.    Time  8    Period  Weeks     Status  On-going      PT LONG TERM GOAL #4   Title  Increase ROM so patient is able to reach behind back to L3.    Time  8    Period  Weeks    Status  On-going      PT LONG TERM GOAL #5   Title  Increase shoulder strength to a solid 4+/5 to increase stability for performance of functional activities.    Time  8    Period  Weeks    Status  On-going      PT LONG TERM GOAL #6   Title  Perform ADL's with pain not > 2-3/10.    Time  8    Period  Weeks    Status  On-going            Plan - 02/13/18 1229    Clinical Impression Statement  Patient did well with treatment today and experienced pain relief following.  CC is anterior right shoulder pain in region of bicipital groove.    Clinical Impairments Affecting Rehab Potential  FOTO initial limitation 58% current 5th visit 37%    PT Treatment/Interventions  ADLs/Self Care Home Management;Moist Heat;Electrical Stimulation;Cryotherapy;Ultrasound;Functional mobility training;Therapeutic activities;Therapeutic exercise;Passive range of motion;Manual techniques;Patient/family education    PT Home Exercise Plan  wall slides, table slides, shoulder 4 way isometrics    Consulted and Agree with Plan of Care  Patient       Patient will benefit from skilled therapeutic intervention in order to improve the following deficits and impairments:  Pain, Postural dysfunction, Impaired UE functional use, Decreased strength  Visit Diagnosis: Acute pain of right shoulder  Stiffness of right shoulder, not elsewhere classified     Problem List Patient Active Problem List   Diagnosis Date Noted  . Cervical spondylosis with radiculopathy 06/15/2016    Jon Wong, Jon Wong 02/13/2018, 12:31 PM  [facility] [address] [place], [place], [address] Phone: [phone]   Fax:  [phone]  Name: Jon Wong MRN: [id number] Date of Birth: [birth year]

## 2018-02-15 ENCOUNTER — Encounter: Payer: Self-pay | Admitting: Physical Therapy

## 2018-02-15 ENCOUNTER — Ambulatory Visit: Payer: BLUE CROSS/BLUE SHIELD | Attending: Orthopaedic Surgery | Admitting: Physical Therapy

## 2018-02-15 DIAGNOSIS — M25521 Pain in right elbow: Secondary | ICD-10-CM | POA: Insufficient documentation

## 2018-02-15 DIAGNOSIS — M25511 Pain in right shoulder: Secondary | ICD-10-CM | POA: Insufficient documentation

## 2018-02-15 DIAGNOSIS — M6281 Muscle weakness (generalized): Secondary | ICD-10-CM | POA: Diagnosis present

## 2018-02-15 DIAGNOSIS — M542 Cervicalgia: Secondary | ICD-10-CM | POA: Diagnosis present

## 2018-02-15 DIAGNOSIS — M25611 Stiffness of right shoulder, not elsewhere classified: Secondary | ICD-10-CM | POA: Insufficient documentation

## 2018-02-15 DIAGNOSIS — G5621 Lesion of ulnar nerve, right upper limb: Secondary | ICD-10-CM | POA: Insufficient documentation

## 2018-02-15 DIAGNOSIS — R293 Abnormal posture: Secondary | ICD-10-CM | POA: Insufficient documentation

## 2018-02-15 NOTE — Therapy (Signed)
[facility] [address] [place], [place], [address] Phone: [phone]   Fax:  [phone]  Physical Therapy Treatment  Patient Details  Name: Jon MedinaRandy O Wong MRN: [id number] Date of Birth: 06/16/1961 Referring Provider: Ramond Marrowax Varkey MD.   Encounter Date: 02/15/2018  PT End of Session - 02/15/18 1103    Visit Number  7    Number of Visits  16    Date for PT Re-Evaluation  04/25/18    Authorization Type  FOTO AT LEAST EVERY 5TH VISIT, 10TH VISIT PROGRESS NOTE AND KX MODIFIER AFTER THE 15 VISIT.    PT Start Time  1029    PT Stop Time  1117    PT Time Calculation (min)  48 min    Activity Tolerance  Patient limited by pain    Behavior During Therapy  [facility] for tasks assessed/performed       Past Medical History:  Diagnosis Date  . Afib (HCC)    pt denies this  . Anxiety   . Arthritis    neck  . 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 Constipation   . Diabetes mellitus without complication (HCC)    type 2  . Family history of adverse reaction to anesthesia    mom is allergic to anesthesia per pt  . GERD (gastroesophageal reflux disease)   . Headache    migraines   . History of kidney stones    27 stones  . Hypertension   . Kidney stones   . Neuropathy    in feet  . Pneumonia   . Ruptured disk     Past Surgical History:  Procedure Laterality Date  . BACK SURGERY     cervical spine surgery  . CARPAL TUNNEL RELEASE Right 2010  . COLONOSCOPY    . HAND SURGERY Right    removed a bone from hand  . POSTERIOR CERVICAL FUSION/FORAMINOTOMY N/A 06/15/2016   Procedure: CERVICAL THREE- FOUR CERVICAL FOUR- FIVE CERVICAL FIVE-SIX CERVICAL SIX- SEVEN POSTERIOR CERVICAL FUSION WITH LATERAL MASS FIXATION;  Surgeon: Hilda LiasErnesto Botero, MD;  Location: [facility];  Service: Neurosurgery;  Laterality: N/A;  C3-4 C4-5 C5-6 C6-7 POSTERIOR CERVICAL FUSION WITH LATERAL MASS FIXATION    There were no vitals filed for this  visit.  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    Patient Stated Goals  Use right UE without pain.    Currently in Pain?  Yes    Pain Score  7     Pain Location  Shoulder    Pain Orientation  Right;Anterior    Pain Descriptors / Indicators  Aching;Discomfort    Pain Type  Acute pain    Pain Onset  More than a month ago    Pain Frequency  Intermittent    Aggravating Factors   use of shoulder    Pain Relieving Factors  rest                       [facility] Adult PT Treatment/Exercise - 02/15/18 0001      Moist Heat Therapy   Number Minutes Moist Heat  15 Minutes    Moist Heat Location  Shoulder  Programme researcher, broadcasting/film/video Location  Right shoulder.    Electrical Stimulation Action  IFC    Electrical Stimulation Parameters  80-150hz  x22min    Electrical Stimulation Goals  Pain      Ultrasound   Ultrasound Location  right ant shoulder    Ultrasound Parameters  combo US/ES @ 1.5w/cm2/100%/84mhz x32min    Ultrasound Goals  Pain      Manual Therapy   Manual Therapy  Soft tissue mobilization    Soft tissue mobilization  gentle STW to ant shoulder, bicep and deltoid to decrease pain and muscle soreness               PT Short Term Goals - 01/25/18 1257      PT SHORT TERM GOAL #1   Title  STG's=LTG's.        PT Long Term Goals - 02/08/18 1137      PT LONG TERM GOAL #1   Title  Patient will be independent with HEP.    Time  8    Period  Weeks    Status  On-going      PT LONG TERM GOAL #2   Title  Active right shoulder flexion to 145 degrees so the patient can easily reach overhead.    Time  8    Period  Weeks    Status  On-going      PT LONG TERM GOAL #3   Title  Active ER to 70 degrees+ to allow  for easily donning/doffing of apparel.    Time  8    Period  Weeks    Status  On-going      PT LONG TERM GOAL #4   Title  Increase ROM so patient is able to reach behind back to L3.    Time  8    Period  Weeks    Status  On-going      PT LONG TERM GOAL #5   Title  Increase shoulder strength to a solid 4+/5 to increase stability for performance of functional activities.    Time  8    Period  Weeks    Status  On-going      PT LONG TERM GOAL #6   Title  Perform ADL's with pain not > 2-3/10.    Time  8    Period  Weeks    Status  On-going            Plan - 02/15/18 1104    Clinical Impression Statement  Today focused on STW and modaliteies due to 2 flare ups this week. Patient unable to perform exercises today. Patient has most discomfort in right ant shoulder/bicipital groove area. Patient has a F/U next week with MD and may need MRI per PT. Patient goals ongoing at this time.     Rehab Potential  Good    Clinical Impairments Affecting Rehab Potential  FOTO initial limitation 58% current 5th visit 37%    PT Treatment/Interventions  ADLs/Self Care Home Management;Moist Heat;Electrical Stimulation;Cryotherapy;Ultrasound;Functional mobility training;Therapeutic activities;Therapeutic exercise;Passive range of motion;Manual techniques;Patient/family education    PT Next Visit Plan  cont with POC for gentle shoulder ROM, gentle strengtheing/stabilization, STW, modalities as needed MD note needed next visit    Consulted and Agree with Plan of Care  Patient       Patient will benefit from skilled therapeutic intervention in order to improve the following deficits and impairments:  Pain, Postural dysfunction, Impaired UE functional use, Decreased strength  Visit Diagnosis: Acute pain of right shoulder  Stiffness of right shoulder, not elsewhere classified  Muscle weakness (generalized)     Problem List Patient Active Problem List   Diagnosis Date Noted  . Cervical  spondylosis with radiculopathy 06/15/2016    Tye Vigo P, PTA 02/15/2018, 11:20 AM  [facility] [address] [place], [place], [address] Phone: [phone]   Fax:  [phone]  Name: DIVON KRABILL MRN: [id number] Date of Birth: January 13, 1961

## 2018-02-20 ENCOUNTER — Ambulatory Visit: Payer: BLUE CROSS/BLUE SHIELD | Admitting: Physical Therapy

## 2018-02-20 DIAGNOSIS — M25521 Pain in right elbow: Secondary | ICD-10-CM

## 2018-02-20 DIAGNOSIS — M6281 Muscle weakness (generalized): Secondary | ICD-10-CM

## 2018-02-20 DIAGNOSIS — M25511 Pain in right shoulder: Secondary | ICD-10-CM

## 2018-02-20 DIAGNOSIS — R293 Abnormal posture: Secondary | ICD-10-CM

## 2018-02-20 DIAGNOSIS — G5621 Lesion of ulnar nerve, right upper limb: Secondary | ICD-10-CM

## 2018-02-20 DIAGNOSIS — M25611 Stiffness of right shoulder, not elsewhere classified: Secondary | ICD-10-CM

## 2018-02-20 DIAGNOSIS — M542 Cervicalgia: Secondary | ICD-10-CM

## 2018-02-20 NOTE — Therapy (Addendum)
[facility] [place], [place], [address] Phone: [phone]   Fax:  [phone]  Physical Therapy Treatment  Patient Details  Name: [name] MRN: [id number] Date of Birth: 1960-11-02 Referring Provider: Ophelia Charter MD.   Encounter Date: 02/20/2018  PT End of Session - 02/20/18 1026    Visit Number  8    Number of Visits  16    Date for PT Re-Evaluation  04/25/18    Authorization Type  FOTO AT LEAST EVERY 5TH VISIT, 10TH VISIT PROGRESS NOTE AND KX MODIFIER AFTER THE 15 VISIT.    PT Start Time  0945    PT Stop Time  1035    PT Time Calculation (min)  50 min    Activity Tolerance  Patient tolerated treatment well    Behavior During Therapy  WFL for tasks assessed/performed       Past Medical History:  Diagnosis Date  . Afib ([place])    pt denies this  . Anxiety   . Arthritis    neck  . 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 Constipation   . Diabetes mellitus without complication ([place])    type 2  . Family history of adverse reaction to anesthesia    mom is allergic to anesthesia per pt  . GERD (gastroesophageal reflux disease)   . Headache    migraines   . History of kidney stones    27 stones  . Hypertension   . Kidney stones   . Neuropathy    in feet  . Pneumonia   . Ruptured disk     Past Surgical History:  Procedure Laterality Date  . BACK SURGERY     cervical spine surgery  . CARPAL TUNNEL RELEASE Right 2010  . COLONOSCOPY    . HAND SURGERY Right    removed a bone from hand  . POSTERIOR CERVICAL FUSION/FORAMINOTOMY N/A 06/15/2016   Procedure: CERVICAL THREE- FOUR CERVICAL FOUR- FIVE CERVICAL FIVE-SIX CERVICAL SIX- SEVEN POSTERIOR CERVICAL FUSION WITH LATERAL MASS FIXATION;  Surgeon: Jon Cha, MD;  Location: [place];  Service: Neurosurgery;  Laterality: N/A;  C3-4 C4-5 C5-6 C6-7 POSTERIOR CERVICAL FUSION WITH LATERAL MASS FIXATION    There were no vitals filed for this  visit.  Subjective Assessment - 02/20/18 1022    Subjective  Pt arriving to therapy today reporting another fall over the weekend at church on Sunday. Pt fell asgainst the wall and and used his R shoulder to grab the wall. Pt reporting increased pain today in his R anterior shoulder.     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    Patient Stated Goals  Use right UE without pain.    Currently in Pain?  Yes    Pain Score  8     Pain Location  Shoulder    Pain Orientation  Right    Pain Descriptors / Indicators  Aching;Constant;Discomfort    Pain Type  Acute pain    Pain Onset  More than a month ago    Aggravating Factors   using shoulder    Pain Relieving Factors  rest, heat                       [facility] Adult PT Treatment/Exercise - 02/20/18 0001      Modalities   Modalities  Electrical Stimulation;Moist Heat;Ultrasound      Moist Heat  Therapy   Number Minutes Moist Heat  15 Minutes    Moist Heat Location  Shoulder      Electrical Stimulation   Electrical Stimulation Location  right shoulder    Electrical Stimulation Action  Pre-mod    Electrical Stimulation Parameters  80-150 Hz x 15 minutes    Electrical Stimulation Goals  Pain      Ultrasound   Ultrasound Location  right anterior shoulder    Ultrasound Parameters  combo US/ES 1.5w/cm2, !mHz,  x 8 minutes    Ultrasound Goals  Pain      Manual Therapy   Manual Therapy  Soft tissue mobilization    Soft tissue mobilization  gentle STW to ant shoulder, bicep and deltoid to decrease pain and muscle soreness             PT Education - 02/20/18 1025    Education Details  continue his HEP and mobility as tolerated    Person(s) Educated  Patient    Methods  Explanation    Comprehension  Verbalized understanding       PT Short Term Goals - 01/25/18 1257      PT SHORT TERM GOAL #1   Title  STG's=LTG's.        PT Long Term Goals - 02/20/18 1026      PT LONG TERM  GOAL #1   Title  Patient will be independent with HEP.    Time  8    Period  Weeks    Status  On-going      PT LONG TERM GOAL #2   Title  Active right shoulder flexion to 145 degrees so the patient can easily reach overhead.    Time  8    Status  On-going      PT LONG TERM GOAL #3   Title  Active ER to 70 degrees+ to allow for easily donning/doffing of apparel.    Time  8    Period  Weeks    Status  On-going      PT LONG TERM GOAL #4   Title  Increase ROM so patient is able to reach behind back to L3.    Time  8    Period  Weeks    Status  On-going      PT LONG TERM GOAL #5   Title  Increase shoulder strength to a solid 4+/5 to increase stability for performance of functional activities.    Time  8    Period  Weeks    Status  On-going      PT LONG TERM GOAL #6   Title  Perform ADL's with pain not > 2-3/10.    Time  8    Period  Weeks    Status  On-going              Patient will benefit from skilled therapeutic intervention in order to improve the following deficits and impairments:     Visit Diagnosis: Acute pain of right shoulder  Stiffness of right shoulder, not elsewhere classified  Muscle weakness (generalized)  Abnormal posture  Ulnar neuropathy at elbow, right  Pain in right elbow  Cervicalgia     Problem List Patient Active Problem List   Diagnosis Date Noted  . Cervical spondylosis with radiculopathy 06/15/2016    Jon Wong, MPT 02/20/2018, 10:30 AM  [facility] [address] [place], [place], [address] Phone: [phone]   Fax:  [phone]  Name: Jon Wong MRN: [id number] Date of Birth: August 15, 1960  PHYSICAL THERAPY DISCHARGE SUMMARY  Visits from Start of Care: 8.  Current functional level related to goals / functional outcomes: See above.   Remaining deficits: No goals met.   Education / Equipment: HEP. Plan: Patient agrees to discharge.  Patient goals were  not met. Patient is being discharged due to lack of progress.  ?????         Jon Wong MPT

## 2018-03-18 DIAGNOSIS — M24111 Other articular cartilage disorders, right shoulder: Secondary | ICD-10-CM

## 2018-03-18 DIAGNOSIS — S46019A Strain of muscle(s) and tendon(s) of the rotator cuff of unspecified shoulder, initial encounter: Secondary | ICD-10-CM

## 2018-03-18 DIAGNOSIS — M7541 Impingement syndrome of right shoulder: Secondary | ICD-10-CM

## 2018-03-18 DIAGNOSIS — M19011 Primary osteoarthritis, right shoulder: Secondary | ICD-10-CM

## 2018-03-18 HISTORY — DX: Strain of muscle(s) and tendon(s) of the rotator cuff of unspecified shoulder, initial encounter: S46.019A

## 2018-03-18 HISTORY — DX: Impingement syndrome of right shoulder: M75.41

## 2018-03-18 HISTORY — DX: Other articular cartilage disorders, right shoulder: M24.111

## 2018-03-18 HISTORY — DX: Primary osteoarthritis, right shoulder: M19.011

## 2018-03-22 ENCOUNTER — Encounter (HOSPITAL_BASED_OUTPATIENT_CLINIC_OR_DEPARTMENT_OTHER): Payer: Self-pay | Admitting: *Deleted

## 2018-03-22 ENCOUNTER — Other Ambulatory Visit: Payer: Self-pay

## 2018-03-22 NOTE — Pre-Procedure Instructions (Signed)
To come for BMET and EKG 

## 2018-03-27 ENCOUNTER — Encounter (HOSPITAL_BASED_OUTPATIENT_CLINIC_OR_DEPARTMENT_OTHER)
Admission: RE | Admit: 2018-03-27 | Discharge: 2018-03-27 | Disposition: A | Discharge status: Home / Self Care | Payer: BLUE CROSS/BLUE SHIELD | Source: Ambulatory Visit | Attending: Orthopaedic Surgery | Admitting: Orthopaedic Surgery

## 2018-03-27 DIAGNOSIS — Z01818 Encounter for other preprocedural examination: Secondary | ICD-10-CM | POA: Diagnosis present

## 2018-03-27 LAB — BASIC METABOLIC PANEL
ANION GAP: 13 (ref 5–15)
BUN: 12 mg/dL (ref 6–20)
CO2: 20 mmol/L — AB (ref 22–32)
CREATININE: 1.18 mg/dL (ref 0.61–1.24)
Calcium: 9.8 mg/dL (ref 8.9–10.3)
Chloride: 102 mmol/L (ref 98–111)
GFR calc Af Amer: >60 mL/min (ref >60)
GFR calc non Af Amer: >60 mL/min (ref >60)
GLUCOSE: 368 mg/dL — AB (ref 70–99)
POTASSIUM: 5 mmol/L (ref 3.5–5.1)
Sodium: 135 mmol/L (ref 135–145)

## 2018-03-27 NOTE — Progress Notes (Addendum)
EKG reviewed by Dr. Michelle Piper, will proceed with surgery as scheduled.  Lab result- glucose=368, reported to Dr. Michelle Piper, Notified Sherri at Dr. Warren Danes office that pt needs to follow up with primary care physician.  Sherri stated that she would let pt. Know.

## 2018-03-29 ENCOUNTER — Ambulatory Visit (HOSPITAL_BASED_OUTPATIENT_CLINIC_OR_DEPARTMENT_OTHER)
Admission: RE | Admit: 2018-03-29 | Payer: BLUE CROSS/BLUE SHIELD | Source: Ambulatory Visit | Admitting: Orthopaedic Surgery

## 2018-03-29 HISTORY — DX: Stiffness of unspecified joint, not elsewhere classified: M25.60

## 2018-03-29 HISTORY — DX: Type 2 diabetes mellitus without complications: E11.9

## 2018-03-29 HISTORY — DX: Primary osteoarthritis, right shoulder: M19.011

## 2018-03-29 HISTORY — DX: Personal history of other diseases of the digestive system: Z87.19

## 2018-03-29 HISTORY — DX: Other articular cartilage disorders, right shoulder: M24.111

## 2018-03-29 HISTORY — DX: Other hemorrhoids: K64.8

## 2018-03-29 HISTORY — DX: Lesion of ulnar nerve, right upper limb: G56.21

## 2018-03-29 HISTORY — DX: Presence of dental prosthetic device (complete) (partial): Z97.2

## 2018-03-29 HISTORY — DX: Palmar fascial fibromatosis (dupuytren): M72.0

## 2018-03-29 HISTORY — DX: Personal history of other malignant neoplasm of skin: Z85.828

## 2018-03-29 HISTORY — DX: Impingement syndrome of right shoulder: M75.41

## 2018-03-29 HISTORY — DX: Strain of muscle(s) and tendon(s) of the rotator cuff of unspecified shoulder, initial encounter: S46.019A

## 2018-03-29 SURGERY — ARTHROSCOPY, SHOULDER, WITH ROTATOR CUFF REPAIR
Anesthesia: Choice | Laterality: Right

## 2018-04-20 ENCOUNTER — Encounter (HOSPITAL_BASED_OUTPATIENT_CLINIC_OR_DEPARTMENT_OTHER): Payer: Self-pay | Admitting: *Deleted

## 2018-04-20 NOTE — Progress Notes (Signed)
Patient's surgery was cancelled on 03-28-18 due to Glucose reading of 368.  Patient went to Dr Samuel Jester his PCP and was put on  Keto diet. He had HgbA1c drawn 04-03-18 that was 7.4. He went for followup 04-16-18 and Glu was 116 and he was down 9lbs. Patient states his sugars have been running 110-120.

## 2018-04-26 ENCOUNTER — Encounter (HOSPITAL_BASED_OUTPATIENT_CLINIC_OR_DEPARTMENT_OTHER)
Admission: RE | Disposition: A | Discharge status: Home / Self Care | Payer: Self-pay | Source: Ambulatory Visit | Attending: Orthopaedic Surgery

## 2018-04-26 ENCOUNTER — Ambulatory Visit (HOSPITAL_BASED_OUTPATIENT_CLINIC_OR_DEPARTMENT_OTHER)
Admission: RE | Admit: 2018-04-26 | Discharge: 2018-04-26 | Disposition: A | Discharge status: Home / Self Care | Payer: BLUE CROSS/BLUE SHIELD | Source: Ambulatory Visit | Attending: Orthopaedic Surgery | Admitting: Orthopaedic Surgery

## 2018-04-26 ENCOUNTER — Ambulatory Visit (HOSPITAL_BASED_OUTPATIENT_CLINIC_OR_DEPARTMENT_OTHER): Payer: BLUE CROSS/BLUE SHIELD | Admitting: Anesthesiology

## 2018-04-26 ENCOUNTER — Other Ambulatory Visit: Payer: Self-pay

## 2018-04-26 ENCOUNTER — Encounter (HOSPITAL_BASED_OUTPATIENT_CLINIC_OR_DEPARTMENT_OTHER): Payer: Self-pay | Admitting: *Deleted

## 2018-04-26 DIAGNOSIS — M75101 Unspecified rotator cuff tear or rupture of right shoulder, not specified as traumatic: Secondary | ICD-10-CM | POA: Insufficient documentation

## 2018-04-26 DIAGNOSIS — Z85828 Personal history of other malignant neoplasm of skin: Secondary | ICD-10-CM | POA: Diagnosis not present

## 2018-04-26 DIAGNOSIS — I10 Essential (primary) hypertension: Secondary | ICD-10-CM | POA: Insufficient documentation

## 2018-04-26 DIAGNOSIS — M7521 Bicipital tendinitis, right shoulder: Secondary | ICD-10-CM | POA: Diagnosis not present

## 2018-04-26 DIAGNOSIS — F419 Anxiety disorder, unspecified: Secondary | ICD-10-CM | POA: Insufficient documentation

## 2018-04-26 DIAGNOSIS — Z981 Arthrodesis status: Secondary | ICD-10-CM | POA: Diagnosis not present

## 2018-04-26 DIAGNOSIS — K219 Gastro-esophageal reflux disease without esophagitis: Secondary | ICD-10-CM | POA: Diagnosis not present

## 2018-04-26 DIAGNOSIS — Z7984 Long term (current) use of oral hypoglycemic drugs: Secondary | ICD-10-CM | POA: Insufficient documentation

## 2018-04-26 DIAGNOSIS — E1140 Type 2 diabetes mellitus with diabetic neuropathy, unspecified: Secondary | ICD-10-CM | POA: Diagnosis not present

## 2018-04-26 DIAGNOSIS — M19011 Primary osteoarthritis, right shoulder: Secondary | ICD-10-CM | POA: Diagnosis not present

## 2018-04-26 DIAGNOSIS — Z79899 Other long term (current) drug therapy: Secondary | ICD-10-CM | POA: Diagnosis not present

## 2018-04-26 DIAGNOSIS — M1990 Unspecified osteoarthritis, unspecified site: Secondary | ICD-10-CM | POA: Insufficient documentation

## 2018-04-26 DIAGNOSIS — Z87891 Personal history of nicotine dependence: Secondary | ICD-10-CM | POA: Diagnosis not present

## 2018-04-26 HISTORY — PX: SHOULDER ARTHROSCOPY WITH SUBACROMIAL DECOMPRESSION, ROTATOR CUFF REPAIR AND BICEP TENDON REPAIR: SHX5687

## 2018-04-26 LAB — GLUCOSE, CAPILLARY
GLUCOSE-CAPILLARY: 152 mg/dL — AB (ref 70–99)
Glucose-Capillary: 174 mg/dL — ABNORMAL HIGH (ref 70–99)

## 2018-04-26 SURGERY — SHOULDER ARTHROSCOPY WITH SUBACROMIAL DECOMPRESSION, ROTATOR CUFF REPAIR AND BICEP TENDON REPAIR
Anesthesia: General | Site: Shoulder | Laterality: Right

## 2018-04-26 MED ORDER — PHENYLEPHRINE HCL 10 MG/ML IJ SOLN
INTRAMUSCULAR | Status: AC
  Filled 2018-04-26: qty 1

## 2018-04-26 MED ORDER — SUGAMMADEX SODIUM 200 MG/2ML IV SOLN
INTRAVENOUS | Status: DC | PRN
  Administered 2018-04-26: 200 mg via INTRAVENOUS

## 2018-04-26 MED ORDER — PROPOFOL 10 MG/ML IV BOLUS
INTRAVENOUS | Status: DC | PRN
  Administered 2018-04-26: 170 mg via INTRAVENOUS

## 2018-04-26 MED ORDER — LIDOCAINE 2% (20 MG/ML) 5 ML SYRINGE
INTRAMUSCULAR | Status: DC | PRN
  Administered 2018-04-26: 50 mg via INTRAVENOUS

## 2018-04-26 MED ORDER — OXYCODONE HCL 5 MG PO TABS: 5.0000 mg | ORAL_TABLET | Freq: Once | ORAL | Status: DC | PRN

## 2018-04-26 MED ORDER — FENTANYL CITRATE (PF) 100 MCG/2ML IJ SOLN
50.0000 ug | INTRAMUSCULAR | Status: DC | PRN
  Administered 2018-04-26: 100 ug via INTRAVENOUS

## 2018-04-26 MED ORDER — SODIUM CHLORIDE 0.9 % IV SOLN
INTRAVENOUS | Status: DC | PRN
  Administered 2018-04-26: 20 ug/min via INTRAVENOUS

## 2018-04-26 MED ORDER — PHENYLEPHRINE 40 MCG/ML (10ML) SYRINGE FOR IV PUSH (FOR BLOOD PRESSURE SUPPORT)
PREFILLED_SYRINGE | INTRAVENOUS | Status: AC
  Filled 2018-04-26: qty 10

## 2018-04-26 MED ORDER — FENTANYL CITRATE (PF) 100 MCG/2ML IJ SOLN
INTRAMUSCULAR | Status: AC
  Filled 2018-04-26: qty 2

## 2018-04-26 MED ORDER — FENTANYL CITRATE (PF) 100 MCG/2ML IJ SOLN: 25.0000 ug | INTRAMUSCULAR | Status: DC | PRN

## 2018-04-26 MED ORDER — EPHEDRINE 5 MG/ML INJ
INTRAVENOUS | Status: AC
  Filled 2018-04-26: qty 10

## 2018-04-26 MED ORDER — CLINDAMYCIN PHOSPHATE 900 MG/50ML IV SOLN
900.0000 mg | INTRAVENOUS | Status: AC
  Administered 2018-04-26: 900 mg via INTRAVENOUS

## 2018-04-26 MED ORDER — OXYCODONE HCL 5 MG/5ML PO SOLN: 5.0000 mg | Freq: Once | ORAL | Status: DC | PRN

## 2018-04-26 MED ORDER — ROCURONIUM BROMIDE 50 MG/5ML IV SOSY
PREFILLED_SYRINGE | INTRAVENOUS | Status: AC
  Filled 2018-04-26: qty 5

## 2018-04-26 MED ORDER — PROPOFOL 500 MG/50ML IV EMUL
INTRAVENOUS | Status: AC
  Filled 2018-04-26: qty 50

## 2018-04-26 MED ORDER — CHLORHEXIDINE GLUCONATE 4 % EX LIQD: 60.0000 mL | Freq: Once | CUTANEOUS | Status: DC

## 2018-04-26 MED ORDER — ONDANSETRON HCL 4 MG/2ML IJ SOLN
INTRAMUSCULAR | Status: DC | PRN
  Administered 2018-04-26: 4 mg via INTRAVENOUS

## 2018-04-26 MED ORDER — EPINEPHRINE 30 MG/30ML IJ SOLN
INTRAMUSCULAR | Status: AC
  Filled 2018-04-26: qty 1

## 2018-04-26 MED ORDER — SCOPOLAMINE 1 MG/3DAYS TD PT72: 1.0000 | MEDICATED_PATCH | Freq: Once | TRANSDERMAL | Status: DC | PRN

## 2018-04-26 MED ORDER — ONDANSETRON HCL 4 MG/2ML IJ SOLN
INTRAMUSCULAR | Status: AC
  Filled 2018-04-26: qty 2

## 2018-04-26 MED ORDER — MIDAZOLAM HCL 2 MG/2ML IJ SOLN
1.0000 mg | INTRAMUSCULAR | Status: DC | PRN
  Administered 2018-04-26: 2 mg via INTRAVENOUS

## 2018-04-26 MED ORDER — BUPIVACAINE-EPINEPHRINE (PF) 0.25% -1:200000 IJ SOLN
INTRAMUSCULAR | Status: AC
  Filled 2018-04-26: qty 30

## 2018-04-26 MED ORDER — CLINDAMYCIN PHOSPHATE 900 MG/50ML IV SOLN
INTRAVENOUS | Status: AC
  Filled 2018-04-26: qty 50

## 2018-04-26 MED ORDER — BUPIVACAINE-EPINEPHRINE (PF) 0.5% -1:200000 IJ SOLN
INTRAMUSCULAR | Status: DC | PRN
  Administered 2018-04-26: 30 mL via PERINEURAL

## 2018-04-26 MED ORDER — LACTATED RINGERS IV SOLN
INTRAVENOUS | Status: DC
  Administered 2018-04-26 (×2): via INTRAVENOUS

## 2018-04-26 MED ORDER — ROCURONIUM BROMIDE 50 MG/5ML IV SOSY
PREFILLED_SYRINGE | INTRAVENOUS | Status: DC | PRN
  Administered 2018-04-26: 50 mg via INTRAVENOUS

## 2018-04-26 MED ORDER — ONDANSETRON HCL 4 MG/2ML IJ SOLN: 4.0000 mg | Freq: Four times a day (QID) | INTRAMUSCULAR | Status: DC | PRN

## 2018-04-26 MED ORDER — DEXAMETHASONE SODIUM PHOSPHATE 10 MG/ML IJ SOLN
INTRAMUSCULAR | Status: AC
  Filled 2018-04-26: qty 1

## 2018-04-26 MED ORDER — OXYCODONE HCL 5 MG PO TABS: ORAL_TABLET | ORAL | 0 refills | Status: AC

## 2018-04-26 MED ORDER — EPHEDRINE SULFATE-NACL 50-0.9 MG/10ML-% IV SOSY
PREFILLED_SYRINGE | INTRAVENOUS | Status: DC | PRN
  Administered 2018-04-26: 5 mg via INTRAVENOUS
  Administered 2018-04-26: 10 mg via INTRAVENOUS
  Administered 2018-04-26: 5 mg via INTRAVENOUS
  Administered 2018-04-26 (×2): 10 mg via INTRAVENOUS

## 2018-04-26 MED ORDER — ONDANSETRON HCL 4 MG PO TABS: 4.0000 mg | ORAL_TABLET | Freq: Three times a day (TID) | ORAL | 1 refills | Status: AC | PRN

## 2018-04-26 MED ORDER — PHENYLEPHRINE 40 MCG/ML (10ML) SYRINGE FOR IV PUSH (FOR BLOOD PRESSURE SUPPORT)
PREFILLED_SYRINGE | INTRAVENOUS | Status: DC | PRN
  Administered 2018-04-26 (×4): 80 ug via INTRAVENOUS
  Administered 2018-04-26: 120 ug via INTRAVENOUS
  Administered 2018-04-26: 80 ug via INTRAVENOUS

## 2018-04-26 MED ORDER — LIDOCAINE 2% (20 MG/ML) 5 ML SYRINGE
INTRAMUSCULAR | Status: AC
  Filled 2018-04-26: qty 5

## 2018-04-26 MED ORDER — ACETAMINOPHEN 500 MG PO TABS: 1000.0000 mg | ORAL_TABLET | Freq: Three times a day (TID) | ORAL | 0 refills | Status: AC

## 2018-04-26 MED ORDER — MIDAZOLAM HCL 2 MG/2ML IJ SOLN
INTRAMUSCULAR | Status: AC
  Filled 2018-04-26: qty 2

## 2018-04-26 MED ORDER — DEXAMETHASONE SODIUM PHOSPHATE 10 MG/ML IJ SOLN
INTRAMUSCULAR | Status: DC | PRN
  Administered 2018-04-26: 5 mg via INTRAVENOUS

## 2018-04-26 SURGICAL SUPPLY — 82 items
ANCHOR SUT BIO SW 4.75X19.1 (Anchor) ×6 IMPLANT
ANCHOR SUT FBRTK 2 TIGTAIL (Anchor) ×6 IMPLANT
BENZOIN TINCTURE PRP APPL 2/3 (GAUZE/BANDAGES/DRESSINGS) ×3 IMPLANT
BLADE EXCALIBUR 4.0MM X 13CM (MISCELLANEOUS)
BLADE EXCALIBUR 4.0X13 (MISCELLANEOUS) IMPLANT
BLADE HEX COATED 2.75 (ELECTRODE) IMPLANT
BLADE SHAVER BONE 5.0MM X 13CM (MISCELLANEOUS)
BLADE SHAVER BONE 5.0X13 (MISCELLANEOUS) IMPLANT
BLADE SURG 10 STRL SS (BLADE) IMPLANT
BNDG COHESIVE 4X5 TAN STRL (GAUZE/BANDAGES/DRESSINGS) IMPLANT
BURR OVAL 8 FLU 4.0MM X 13CM (MISCELLANEOUS)
BURR OVAL 8 FLU 4.0X13 (MISCELLANEOUS) IMPLANT
CANNULA 5.75X71 LONG (CANNULA) IMPLANT
CANNULA PASSPORT 5 (CANNULA) ×2 IMPLANT
CANNULA PASSPORT 5CM (CANNULA) ×1
CANNULA PASSPORT BUTTON 10-40 (CANNULA) IMPLANT
CANNULA TWIST IN 8.25X7CM (CANNULA) IMPLANT
CHLORAPREP W/TINT 26ML (MISCELLANEOUS) ×3 IMPLANT
CLOSURE WOUND 1/2 X4 (GAUZE/BANDAGES/DRESSINGS)
COVER WAND RF STERILE (DRAPES) IMPLANT
DECANTER SPIKE VIAL GLASS SM (MISCELLANEOUS) IMPLANT
DISSECTOR 3.5MM X 13CM CVD (MISCELLANEOUS) IMPLANT
DISSECTOR 4.0MMX13CM CVD (MISCELLANEOUS) IMPLANT
DRAPE IMP U-DRAPE 54X76 (DRAPES) ×3 IMPLANT
DRAPE INCISE IOBAN 66X45 STRL (DRAPES) IMPLANT
DRAPE SHOULDER BEACH CHAIR (DRAPES) ×3 IMPLANT
DRSG PAD ABDOMINAL 8X10 ST (GAUZE/BANDAGES/DRESSINGS) ×3 IMPLANT
ELECT NEEDLE TIP 2.8 STRL (NEEDLE) IMPLANT
ELECT REM PT RETURN 9FT ADLT (ELECTROSURGICAL) ×3
ELECTRODE REM PT RTRN 9FT ADLT (ELECTROSURGICAL) ×1 IMPLANT
GAUZE SPONGE 4X4 12PLY STRL (GAUZE/BANDAGES/DRESSINGS) ×3 IMPLANT
GLOVE BIO SURGEON STRL SZ 6.5 (GLOVE) ×2 IMPLANT
GLOVE BIO SURGEONS STRL SZ 6.5 (GLOVE) ×1
GLOVE BIOGEL PI IND STRL 7.0 (GLOVE) ×1 IMPLANT
GLOVE BIOGEL PI IND STRL 8 (GLOVE) ×2 IMPLANT
GLOVE BIOGEL PI INDICATOR 7.0 (GLOVE) ×2
GLOVE BIOGEL PI INDICATOR 8 (GLOVE) ×4
GLOVE ECLIPSE 6.5 STRL STRAW (GLOVE) ×3 IMPLANT
GLOVE ECLIPSE 8.0 STRL XLNG CF (GLOVE) ×6 IMPLANT
GOWN STRL REUS W/ TWL LRG LVL3 (GOWN DISPOSABLE) ×2 IMPLANT
GOWN STRL REUS W/TWL LRG LVL3 (GOWN DISPOSABLE) ×4
GOWN STRL REUS W/TWL XL LVL3 (GOWN DISPOSABLE) ×3 IMPLANT
KIT SPEAR STR 1.6MM DRILL (MISCELLANEOUS) ×3 IMPLANT
KIT STABILIZATION SHOULDER (MISCELLANEOUS) ×3 IMPLANT
KIT STR SPEAR 1.8 FBRTK DISP (KITS) IMPLANT
LASSO CRESCENT QUICKPASS (SUTURE) IMPLANT
MANIFOLD NEPTUNE II (INSTRUMENTS) ×3 IMPLANT
NDL SAFETY ECLIPSE 18X1.5 (NEEDLE) ×1 IMPLANT
NDL SUT 6 .5 CRC .975X.05 MAYO (NEEDLE) IMPLANT
NEEDLE HYPO 18GX1.5 SHARP (NEEDLE) ×2
NEEDLE MAYO TAPER (NEEDLE)
NEEDLE SCORPION MULTI FIRE (NEEDLE) ×3 IMPLANT
PACK ARTHROSCOPY DSU (CUSTOM PROCEDURE TRAY) ×3 IMPLANT
PACK BASIN DAY SURGERY FS (CUSTOM PROCEDURE TRAY) ×3 IMPLANT
PENCIL BUTTON HOLSTER BLD 10FT (ELECTRODE) IMPLANT
PROBE BIPOLAR ATHRO 135MM 90D (MISCELLANEOUS) ×3 IMPLANT
RESTRAINT HEAD UNIVERSAL NS (MISCELLANEOUS) ×3 IMPLANT
SHEET MEDIUM DRAPE 40X70 STRL (DRAPES) IMPLANT
SLEEVE SCD COMPRESS KNEE MED (MISCELLANEOUS) ×3 IMPLANT
SLING ARM FOAM STRAP LRG (SOFTGOODS) IMPLANT
SLING ARM IMMOBILIZER LRG (SOFTGOODS) IMPLANT
SLING ARM IMMOBILIZER MED (SOFTGOODS) IMPLANT
SLING ARM MED ADULT FOAM STRAP (SOFTGOODS) IMPLANT
SLING ARM XL FOAM STRAP (SOFTGOODS) IMPLANT
SPONGE LAP 4X18 RFD (DISPOSABLE) IMPLANT
STRIP CLOSURE SKIN 1/2X4 (GAUZE/BANDAGES/DRESSINGS) IMPLANT
SUT FIBERWIRE #2 38 T-5 BLUE (SUTURE)
SUT MNCRL AB 4-0 PS2 18 (SUTURE) ×3 IMPLANT
SUT PDS AB 1 CT  36 (SUTURE)
SUT PDS AB 1 CT 36 (SUTURE) IMPLANT
SUT TIGER TAPE 7 IN WHITE (SUTURE) IMPLANT
SUTURE FIBERWR #2 38 T-5 BLUE (SUTURE) IMPLANT
SUTURE TAPE TIGERLINK 1.3MM BL (SUTURE) ×1 IMPLANT
SUTURETAPE TIGERLINK 1.3MM BL (SUTURE) ×3
SYR 5ML LUER SLIP (SYRINGE) ×3 IMPLANT
TAPE FIBER 2MM 7IN #2 BLUE (SUTURE) IMPLANT
TOWEL GREEN STERILE FF (TOWEL DISPOSABLE) ×3 IMPLANT
TOWEL OR NON WOVEN STRL DISP B (DISPOSABLE) ×3 IMPLANT
TUBE CONNECTING 20'X1/4 (TUBING) ×2
TUBE CONNECTING 20X1/4 (TUBING) ×4 IMPLANT
TUBE SUCTION HIGH CAP CLEAR NV (SUCTIONS) IMPLANT
TUBING ARTHROSCOPY IRRIG 16FT (MISCELLANEOUS) ×3 IMPLANT

## 2018-04-26 NOTE — Op Note (Signed)
Orthopaedic Surgery Operative Note (CSN: [id number])  Abrahan Fulmore Yeagle  10-21-1960 Date of Surgery: 04/26/2018   Diagnoses:  Right shoulder rotator cuff tear, AC arthrosis, biceps tenodinitis  Procedure: Right shoulder arthroscopic extensive debridement Biceps tenodesis Distal clavicle excision Subacromial decompression Rotator cuff repair.   Operative Finding Successful completion of planned procedure.   Exam under anesthesia: Full motion, no instability Articular space: No loose bodies, capsule intact but moderate injection of capsule.  Degenerative labral fraying noted anteriorly and posteriorly, debrided with shaver. Chondral surfaces:Intact, no sign of chondral degeneration on the glenoid or humeral head Biceps: Type 2 slap Subscapularis: normal Superior Cuff:appeared normal on articular side throughout Bursal side: 80% involvement of anterior supraspinatus, remaining fibers taken down and full repair done.    Post-operative plan: The patient will be NWB in sling.  The patient will be dc home.  DVT prophylaxis not indicated in isolated upper extremity surgery patient with no specific risks factors .  Pain control with PRN pain medication preferring oral medicines.  Follow up plan will be scheduled in approximately 7 days for incision check and XR.  Post-Op Diagnosis: Same Surgeons:Primary: Bjorn Pippin, MD Assistants:Brandon parry opac Location: [facility] OR ROOM 6 Anesthesia: General Antibiotics: Clinda 900mg  Tourniquet time: * No tourniquets in log * Estimated Blood Loss: minimal Complications: None Specimens: None Implants: Implant Name Type Inv. Item Serial No. Manufacturer Lot No. LRB No. Used Action  ANCHOR SUT BIO SW 4.75X19.1 - [id number] Anchor ANCHOR SUT BIO SW 4.75X19.1  [employer] [id number] Right 1 Implanted  ANCHOR SUT BIO SW 4.75X19.1 - [id number] Anchor ANCHOR SUT BIO SW 4.75X19.1  [employer] [id number] Right 1 Implanted  [facility] SUTURE - [id number] Anchor  [employer] [id number] Right 1 Implanted  [facility] SUTURE - [id number] Anchor ANCHOR [employer] [id number] Right 1 Implanted    Indications for Surgery:   Jon Wong is a 57 y.o. male with continued pain in R shoulder after trial of non-operative measures.  Benefits and risks of operative and nonoperative management were discussed prior to surgery with patient/guardian(s) and informed consent form was completed.  Specific risks including infection, need for additional surgery, continued pain, retear, stiffness, biceps failure   Procedure:   The patient was identified in the preoperative holding area where the surgical site was marked. The patient was taken to the OR where a procedural timeout was called and the above noted anesthesia was induced.  The patient was positioned beachchair on allen table.  Preoperative antibiotics were dosed.  The patient's right shoulder was prepped and draped in the usual sterile fashion.  A second preoperative timeout was called.      Patient was correctly identified in the preoperative holding area and operative site marked.  Patient brought to OR and positioned beachchair on an [place] table ensuring that all bony prominences were padded and the head was in an appropriate location.  Anesthesia was induced and the operative shoulder was prepped and draped in the usual sterile fashion.  Timeout was called preincision.  A standard posterior viewing portal was made after localizing the portal with a spinal needle.  An anterior accessory portal was also made.  After clearing the articular space the camera was positioned in the subacromial space.  Findings above.  Biceps tenotomized with scissors.  Extensive debridement performed of labrum and biceps stump.    Subacromial decompression: We made a lateral portal with spinal needle guidance. We then proceeded to debride  bursal tissue extensively with a shaver and arthrocare  device. At that point we continued to identify the borders of the acromion and identify the spur. We then carefully preserved the deltoid fascia and used a burr to convert the type 2 acromion to a Type 1 flat acromion without issue.  Distal Clavicle resection:  The scope was placed in the subacromial space from the posterior portal.  A hemostat was placed through the anterior portal and we spread at the [facility] joint.  A burr was then inserted and 10 mm of distal clavicle was resected taking care to avoid damage to the capsule around the joint and avoiding overhanging bone posteriorly.    Arthroscopic Rotator Cuff Repair: Tuberosity was prepared with a burr to a bleeding bed.  Following completion of the above we placed 1 4.7 Swivelock anchor loaded with a tape at inserted at the medial articular margin and an scorpion suture passing device, shuttled  utures medially in a horizontal mattress suture configuration.  We then tied using arthroscopic knot tying techniques  each suture to its partner reducing the tendon at the prepared insertion site.  The fiber tape was not tied. With a medial row suture limbs then incorporated into 1 4.75 PEEK SwiveLock anchors, each placed 8 to 10 mm below the tip of the tuberosity and spanning anterior-posterior width of the tear with care to avoid over tensioning.   Biceps tenodesis: We marked the tendon and then performed a tenotomy and debridement of the stump in the articular space. We then identified the biceps tendon in its groove suprapec with the arthroscope in the lateral portal taking care to move from lateral to medial to avoid injury to the subscapularis. At that point we unroofed the tendon itself and mobilized it. An accessory anterior portal was made in line with the tendon and we grasped it from the anterior superior portal and worked from the accessory anterior portal. Two Fibertak 1.63mm anchors were placed in the groove and the tendon was secured in a luggage loop  style fashion with good tension on the tendon.   The incisions were closed with absorbable monocryl, benzoin and steri strips.  A sterile dressing was placed along with a sling. The patient was awoken from general anesthesia and taken to the PACU in stable condition without complication.    Janace Litten, OPA-C, present and scrubbed throughout the case, critical for completion in a timely fashion, and for retraction, instrumentation, closure.

## 2018-04-26 NOTE — Anesthesia Procedure Notes (Signed)
Procedure Name: Intubation Date/Time: 04/26/2018 7:29 AM Performed by: Pearson Grippe, CRNA Pre-anesthesia Checklist: Patient identified, Emergency Drugs available, Suction available and Patient being monitored Patient Re-evaluated:Patient Re-evaluated prior to induction Oxygen Delivery Method: Circle system utilized Preoxygenation: Pre-oxygenation with 100% oxygen Induction Type: IV induction Ventilation: Mask ventilation without difficulty Laryngoscope Size: Miller and 2 Grade View: Grade I Tube type: Oral Tube size: 8.0 mm Number of attempts: 1 Airway Equipment and Method: Stylet and Oral airway Placement Confirmation: ETT inserted through vocal cords under direct vision,  positive ETCO2 and breath sounds checked- equal and bilateral Secured at: 23 cm Tube secured with: Tape Dental Injury: Teeth and Oropharynx as per pre-operative assessment

## 2018-04-26 NOTE — Anesthesia Preprocedure Evaluation (Signed)
Anesthesia Evaluation  Patient identified by MRN, date of birth, ID band Patient awake    Reviewed: Allergy & Precautions, H&P , NPO status , Patient's Chart, lab work & pertinent test results  Airway Mallampati: II   Neck ROM: full    Dental   Pulmonary former smoker,    breath sounds clear to auscultation       Cardiovascular hypertension,  Rhythm:regular Rate:Normal     Neuro/Psych Anxiety  Neuromuscular disease    GI/Hepatic hiatal hernia, GERD  ,  Endo/Other  diabetes, Type 2  Renal/GU      Musculoskeletal  (+) Arthritis ,   Abdominal   Peds  Hematology   Anesthesia Other Findings   Reproductive/Obstetrics                             Anesthesia Physical Anesthesia Plan  ASA: II  Anesthesia Plan: General   Post-op Pain Management:  Regional for Post-op pain   Induction: Intravenous  PONV Risk Score and Plan: 2 and Ondansetron, Dexamethasone, Midazolam and Treatment may vary due to age or medical condition  Airway Management Planned: Oral ETT  Additional Equipment:   Intra-op Plan:   Post-operative Plan: Extubation in OR  Informed Consent: I have reviewed the patients History and Physical, chart, labs and discussed the procedure including the risks, benefits and alternatives for the proposed anesthesia with the patient or authorized representative who has indicated his/her understanding and acceptance.     Plan Discussed with: CRNA, Anesthesiologist and Surgeon  Anesthesia Plan Comments:         Anesthesia Quick Evaluation

## 2018-04-26 NOTE — Progress Notes (Signed)
Assisted Dr. Hodierne with right, ultrasound guided, interscalene  block. Side rails up, monitors on throughout procedure. See vital signs in flow sheet. Tolerated Procedure well. 

## 2018-04-26 NOTE — Discharge Instructions (Signed)

## 2018-04-26 NOTE — H&P (Signed)
PREOPERATIVE H&P  Chief Complaint: RIGHT SHOULDER OSTEOARTHRITIS, IMPINGEMENT SYNDROME, STRAIN OF MUSCLE AND TENDON OF THE ROTATOR CUFF  HPI: Jon Wong is a 57 y.o. male who presents for preoperative history and physical with a diagnosis of RIGHT SHOULDER OSTEOARTHRITIS, IMPINGEMENT SYNDROME, STRAIN OF MUSCLE AND TENDON OF THE ROTATOR CUFF. Symptoms are rated as moderate to severe, and have been worsening.  This is significantly impairing activities of daily living.  Please see my clinic note for full details on this patient's care.  He has elected for surgical management.   Past Medical History:  Diagnosis Date  . Anxiety   . Articular cartilage disorder of shoulder, right 03/2018  . Dupuytren's contracture of right hand   . GERD (gastroesophageal reflux disease)   . History of hiatal hernia   . History of kidney stones   . History of skin cancer    ears  . Hypertension    states under control with med., has been on med. x 2 yr.  . Impingement syndrome of right shoulder 03/2018  . Internal hemorrhoid   . Limited joint range of motion (ROM)    cervical spine - vertical motion  . Neuropathy    bilateral feet  . Non-insulin dependent type 2 diabetes mellitus (HCC)   . Osteoarthritis of right shoulder 03/2018  . Rotator cuff strain 03/2018   right  . Ulnar nerve compression, right   . Wears partial dentures    upper   Past Surgical History:  Procedure Laterality Date  . CARPAL TUNNEL RELEASE Right 2010  . CERVICAL DISC SURGERY  1999  . CERVICAL FUSION  11/06/2002   C4-5  . COLONOSCOPY    . NECK HARDWARE REMOVAL  11/06/2002   C5-6 plate  . NECK HARDWARE REMOVAL  01/29/2008   C4-5  . POSTERIOR CERVICAL FUSION/FORAMINOTOMY N/A 06/15/2016   Procedure: CERVICAL THREE- FOUR CERVICAL FOUR- FIVE CERVICAL FIVE-SIX CERVICAL SIX- SEVEN POSTERIOR CERVICAL FUSION WITH LATERAL MASS FIXATION;  Surgeon: Hilda Lias, MD;  Location: [facility] OR;  Service: Neurosurgery;  Laterality: N/A;   C3-4 C4-5 C5-6 C6-7 POSTERIOR CERVICAL FUSION WITH LATERAL MASS FIXATION  . SHOULDER ARTHROSCOPY Left 1998  . WRIST SURGERY Right 2010   hamate fx.   Social History   Socioeconomic History  . Marital status: Married    Spouse name: Not on file  . Number of children: Not on file  . Years of education: Not on file  . Highest education level: Not on file  Occupational History  . Not on file  Social Needs  . Financial resource strain: Not on file  . Food insecurity:    Worry: Not on file    Inability: Not on file  . Transportation needs:    Medical: Not on file    Non-medical: Not on file  Tobacco Use  . Smoking status: Former Smoker    Last attempt to quit: 06/15/2011    Years since quitting: 6.8  . Smokeless tobacco: Never Used  Substance and Sexual Activity  . Alcohol use: Yes    Comment: rarely  . Drug use: No  . Sexual activity: Not on file  Lifestyle  . Physical activity:    Days per week: Not on file    Minutes per session: Not on file  . Stress: Not on file  Relationships  . Social connections:    Talks on phone: Not on file    Gets together: Not on file    Attends religious service: Not on file  Active member of club or organization: Not on file    Attends meetings of clubs or organizations: Not on file    Relationship status: Not on file  Other Topics Concern  . Not on file  Social History Narrative  . Not on file   Family History  Problem Relation Age of Onset  . Hypertension Mother   . Heart disease Father   . Diabetes Mellitus I Father    Allergies  Allergen Reactions  . Amoxicillin Itching and Swelling        . Hydrocodone Itching   Prior to Admission medications   Medication Sig Start Date End Date Taking? Authorizing Provider  ALPRAZolam Prudy Feeler) 1 MG tablet Take 1 mg by mouth 3 (three) times daily.   Yes [provider]  celecoxib (CELEBREX) 200 MG capsule Take 400 mg by mouth every evening.   Yes [provider]   Cholecalciferol (VITAMIN D-3) 5000 UNITS TABS Take 5,000 Units by mouth daily.   Yes [provider]  citalopram (CELEXA) 20 MG tablet Take 20 mg by mouth daily.  03/18/16  Yes [provider]  dexlansoprazole (DEXILANT) 60 MG capsule Take 60 mg by mouth daily.   Yes [provider]  fenofibrate micronized (LOFIBRA) 134 MG capsule Take 134 mg by mouth every evening.   Yes [provider]  Magnesium 250 MG TABS Take 250 mg by mouth every evening.   Yes [provider]  metFORMIN (GLUCOPHAGE) 500 MG tablet Take 500 mg by mouth 2 (two) times daily with a meal.    Yes [provider]  Omega-3 Fatty Acids (FISH OIL) 1000 MG CAPS Take 1,000 mg by mouth every evening.   Yes [provider]  ramipril (ALTACE) 10 MG capsule Take 10 mg by mouth daily.   Yes [provider]  simvastatin (ZOCOR) 40 MG tablet Take 40 mg by mouth daily.   Yes [provider]     Positive ROS: All other systems have been reviewed and were otherwise negative with the exception of those mentioned in the HPI and as above.  Physical Exam: General: Alert, no acute distress Cardiovascular: No pedal edema Respiratory: No cyanosis, no use of accessory musculature GI: No organomegaly, abdomen is soft and non-tender Skin: No lesions in the area of chief complaint Neurologic: Sensation intact distally Psychiatric: Patient is competent for consent with normal mood and affect Lymphatic: No axillary or cervical lymphadenopathy  MUSCULOSKELETAL: R shoulder pain and weakness w ROM, +ac ttp,   Assessment: RIGHT SHOULDER OSTEOARTHRITIS, IMPINGEMENT SYNDROME, STRAIN OF MUSCLE AND TENDON OF THE ROTATOR CUFF  Plan: Plan for Procedure(s): RIGHT SHOULDER ARTHROSCOPY WITH DEBRIDEMENT, ACROMIOPLASTY, DISTAL CLAVICLE EXCISION, ROTATOR CUFF REPAIR AND BICEP TENODESIS  The risks benefits and alternatives were discussed with the patient including but not limited  to the risks of nonoperative treatment, versus surgical intervention including infection, bleeding, nerve injury,  blood clots, cardiopulmonary complications, morbidity, mortality, among others, and they were willing to proceed.   Bjorn Pippin, MD  04/26/2018 6:40 AM

## 2018-04-26 NOTE — Anesthesia Procedure Notes (Signed)
Anesthesia Regional Block: Interscalene brachial plexus block   Pre-Anesthetic Checklist: ,, timeout performed, Correct Patient, Correct Site, Correct Laterality, Correct Procedure, Correct Position, site marked, Risks and benefits discussed,  Surgical consent,  Pre-op evaluation,  At surgeon's request and post-op pain management  Laterality: Right  Prep: chloraprep       Needles:  Injection technique: Single-shot  Needle Type: Echogenic Stimulator Needle     Needle Length: 5cm  Needle Gauge: 22     Additional Needles:   Procedures:, nerve stimulator,,,,,,,   Nerve Stimulator or Paresthesia:  Response: biceps flexion, 0.45 mA,   Additional Responses:   Narrative:  Start time: 04/26/2018 7:01 AM End time: 04/26/2018 7:10 AM Injection made incrementally with aspirations every 5 mL.  Performed by: Personally  Anesthesiologist: Achille Rich, MD  Additional Notes: Functioning IV was confirmed and monitors were applied.  A 50mm 22ga Arrow echogenic stimulator needle was used. Sterile prep and drape,hand hygiene and sterile gloves were used.  Negative aspiration and negative test dose prior to incremental administration of local anesthetic. The patient tolerated the procedure well.  Ultrasound guidance: relevent anatomy identified, needle position confirmed, local anesthetic spread visualized around nerve(s), vascular puncture avoided.  Image printed for medical record.

## 2018-04-26 NOTE — Transfer of Care (Signed)
Immediate Anesthesia Transfer of Care Note  Patient: Jon Wong  Procedure(s) Performed: RIGHT SHOULDER ARTHROSCOPY WITH DEBRIDEMENT, ACROMIOPLASTY, DISTAL CLAVICLE EXCISION, ROTATOR CUFF REPAIR AND BICEP TENODESIS (Right Shoulder)  Patient Location: PACU  Anesthesia Type:GA combined with regional for post-op pain  Level of Consciousness: awake, alert  and oriented  Airway & Oxygen Therapy: Patient Spontanous Breathing and Patient connected to face mask oxygen  Post-op Assessment: Report given to RN and Post -op Vital signs reviewed and stable  Post vital signs: Reviewed and stable  Last Vitals:  Vitals Value Taken Time  BP 121/78 04/26/2018  9:17 AM  Temp    Pulse 107 04/26/2018  9:18 AM  Resp 25 04/26/2018  9:18 AM  SpO2 97 % 04/26/2018  9:18 AM  Vitals shown include unvalidated device data.  Last Pain:  Vitals:   04/26/18 0637  TempSrc: Oral  PainSc: 5          Complications: No apparent anesthesia complications

## 2018-04-27 NOTE — Anesthesia Postprocedure Evaluation (Signed)
Anesthesia Post Note  Patient: Finnlee Silvernail [employer]  Procedure(s) Performed: RIGHT SHOULDER ARTHROSCOPY WITH DEBRIDEMENT, ACROMIOPLASTY, DISTAL CLAVICLE EXCISION, ROTATOR CUFF REPAIR AND BICEP TENODESIS (Right Shoulder)     Patient location during evaluation: PACU Anesthesia Type: General Level of consciousness: awake and alert Pain management: pain level controlled Vital Signs Assessment: post-procedure vital signs reviewed and stable Respiratory status: spontaneous breathing, nonlabored ventilation, respiratory function stable and patient connected to nasal cannula oxygen Cardiovascular status: blood pressure returned to baseline and stable Postop Assessment: no apparent nausea or vomiting Anesthetic complications: no    Last Vitals:  Vitals:   04/26/18 1000 04/26/18 1017  BP: 120/79 128/80  Pulse: (!) 107 (!) 106  Resp: (!) 25 18  Temp:  36.5 C  SpO2: 92% 94%    Last Pain:  Vitals:   04/26/18 1017  TempSrc: Oral                 Irelynd Zumstein S

## 2018-04-29 ENCOUNTER — Encounter (HOSPITAL_BASED_OUTPATIENT_CLINIC_OR_DEPARTMENT_OTHER): Payer: Self-pay | Admitting: Orthopaedic Surgery

## 2018-05-08 ENCOUNTER — Ambulatory Visit: Payer: BLUE CROSS/BLUE SHIELD | Attending: Orthopaedic Surgery | Admitting: Physical Therapy

## 2018-05-08 ENCOUNTER — Encounter: Payer: Self-pay | Admitting: Physical Therapy

## 2018-05-08 DIAGNOSIS — M25611 Stiffness of right shoulder, not elsewhere classified: Secondary | ICD-10-CM | POA: Diagnosis present

## 2018-05-08 DIAGNOSIS — M25511 Pain in right shoulder: Secondary | ICD-10-CM | POA: Diagnosis not present

## 2018-05-08 DIAGNOSIS — M6281 Muscle weakness (generalized): Secondary | ICD-10-CM | POA: Diagnosis present

## 2018-05-08 NOTE — Therapy (Signed)
[facility] [address] [place], [place], [address] Phone: [phone]   Fax:  [phone]  Physical Therapy Evaluation  Patient Details  Name: Jon Wong MRN: [id number] Date of Birth: March 29, 1961 Referring Provider (PT): Ramond Marrow MD.   Encounter Date: 05/08/2018  PT End of Session - 05/08/18 1627    Visit Number  1    Number of Visits  16    Date for PT Re-Evaluation  07/03/18    Authorization Type  FOTO AT LEAST EVERY 5TH VISIT, 10TH VISIT PROGRESS NOTE AND KX MODIFIER AFTER THE 15 VISIT.  (Pended)     PT Start Time  0315    PT Stop Time  0402    PT Time Calculation (min)  47 min       Past Medical History:  Diagnosis Date  . Anxiety   . Articular cartilage disorder of shoulder, right 03/2018  . Dupuytren's contracture of right hand   . GERD (gastroesophageal reflux disease)   . History of hiatal hernia   . History of kidney stones   . History of skin cancer    ears  . Hypertension    states under control with med., has been on med. x 2 yr.  . Impingement syndrome of right shoulder 03/2018  . Internal hemorrhoid   . Limited joint range of motion (ROM)    cervical spine - vertical motion  . Neuropathy    bilateral feet  . Non-insulin dependent type 2 diabetes mellitus (HCC)   . Osteoarthritis of right shoulder 03/2018  . Rotator cuff strain 03/2018   right  . Ulnar nerve compression, right   . Wears partial dentures    upper    Past Surgical History:  Procedure Laterality Date  . CARPAL TUNNEL RELEASE Right 2010  . CERVICAL DISC SURGERY  1999  . CERVICAL FUSION  11/06/2002   C4-5  . COLONOSCOPY    . NECK HARDWARE REMOVAL  11/06/2002   C5-6 plate  . NECK HARDWARE REMOVAL  01/29/2008   C4-5  . POSTERIOR CERVICAL FUSION/FORAMINOTOMY N/A 06/15/2016   Procedure: CERVICAL THREE- FOUR CERVICAL FOUR- FIVE CERVICAL FIVE-SIX CERVICAL SIX- SEVEN POSTERIOR CERVICAL FUSION WITH LATERAL MASS FIXATION;  Surgeon:  Hilda Lias, MD;  Location: [facility];  Service: Neurosurgery;  Laterality: N/A;  C3-4 C4-5 C5-6 C6-7 POSTERIOR CERVICAL FUSION WITH LATERAL MASS FIXATION  . SHOULDER ARTHROSCOPY Left 1998  . SHOULDER ARTHROSCOPY WITH SUBACROMIAL DECOMPRESSION, ROTATOR CUFF REPAIR AND BICEP TENDON REPAIR Right 04/26/2018   Procedure: RIGHT SHOULDER ARTHROSCOPY WITH DEBRIDEMENT, ACROMIOPLASTY, DISTAL CLAVICLE EXCISION, ROTATOR CUFF REPAIR AND BICEP TENODESIS;  Surgeon: Bjorn Pippin, MD;  [facility];  Service: Orthopedics;  Laterality: Right;  . WRIST SURGERY Right 2010   hamate fx.    There were no vitals filed for this visit.   Subjective Assessment - 05/08/18 1637    Subjective  The patient underwent a right shoulder surgery for DCE, SAD, RCR and biceps tenodesis performed on 04/26/18.  His current pain in sling is 6/10.  Medication decreases his pain and being out of sling increases his pain.    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    Patient Stated Goals  Use right UE without pain.    Currently in Pain?  Yes    Pain Score  6     Pain Location  Shoulder    Pain Orientation  Right    Pain Descriptors /  Indicators  Aching;Throbbing    Pain Type  Acute pain    Pain Onset  1 to 4 weeks ago    Pain Frequency  Constant    Aggravating Factors   See above.    Pain Relieving Factors  See above.         [facility] PT Assessment - 05/08/18 0001      Assessment   Medical Diagnosis  Right shoulder DCE, SAD, RCR and biceps tenodesis.    Referring Provider (PT)  Ramond Marrow MD.    Onset Date/Surgical Date  --   04/27/18 (surgery date).     Precautions   Precautions  None      Restrictions   Weight Bearing Restrictions  No      Balance Screen   Has the patient fallen in the past 6 months  No    Has the patient had a decrease in activity level because of a fear of falling?   No    Is the patient reluctant to leave their home because of a fear  of falling?   No      Observation/Other Assessments   Observations  Incisional looking good, posterior site covered on steri-strips.    Focus on Therapeutic Outcomes (FOTO)   96% limitation.      Posture/Postural Control   Posture Comments  Guarded.      ROM / Strength   AROM / PROM / Strength  PROM      PROM   Overall PROM Comments  In supine:  Right shoulder flexion= 45 degrees; ER to 10 degrees and IR to abdomen.      Palpation   Palpation comment  Tender to even light palpation over right anterior shoulder.                Objective measurements completed on examination: See above findings.      [facility] Adult PT Treatment/Exercise - 05/08/18 0001      Modalities   Modalities  Vasopneumatic      Vasopneumatic   Number Minutes Vasopneumatic   --   Right shoulder..pillow btw rt elbow and thorax. 15 minutes.   Vasopneumatic Pressure  Low      Manual Therapy   Passive ROM  In supine:  Gentle passive ROM to right shoulder into flexion and ER.             PT Education - 05/08/18 1642    Education Details  HEP.    Person(s) Educated  Patient;Spouse    Methods  Explanation;Demonstration;Tactile cues;Verbal cues;Handout    Comprehension  Verbalized understanding;Returned demonstration;Tactile cues required;Verbal cues required;Need further instruction       PT Short Term Goals - 01/25/18 1257      PT SHORT TERM GOAL #1   Title  STG's=LTG's.        PT Long Term Goals - 05/08/18 1753      PT LONG TERM GOAL #1   Title  Patient will be independent with HEP.    Time  8    Period  Weeks    Status  New      PT LONG TERM GOAL #2   Title  Active right shoulder flexion to 145 degrees so the patient can easily reach overhead.    Time  8    Period  Weeks    Status  New      PT LONG TERM GOAL #3   Title  Active ER to 70 degrees+ to allow for  easily donning/doffing of apparel.    Time  8    Period  Weeks    Status  New      PT LONG TERM GOAL #4    Title  Increase ROM so patient is able to reach behind back to L3.    Time  8    Period  Weeks    Status  New      PT LONG TERM GOAL #5   Title  Increase shoulder strength to a solid 4+/5 to increase stability for performance of functional activities.    Time  8    Period  Weeks      PT LONG TERM GOAL #6   Title  Perform ADL's with pain not > 2-3/10.    Time  8    Period  Weeks    Status  New             Plan - 05/08/18 1657    Clinical Impression Statement  The patient is s/p 11 days right shoulder surgery.  As expected he has a significant amount of pain and loss of range of motion.  His function regarding his right shoulder per FOTO demonstrates a limitation of 96%. Patient will benefit from skilled physical therapy intervention to address deficits and pain.     Clinical Impairments Affecting Rehab Potential  FOTO initial limitation 58% current 5th visit 37%    PT Treatment/Interventions  ADLs/Self Care Home Management;Moist Heat;Electrical Stimulation;Cryotherapy;Ultrasound;Functional mobility training;Therapeutic activities;Therapeutic exercise;Passive range of motion;Manual techniques;Patient/family education    PT Next Visit Plan  Per protocol:  Please begin with PROM to patient right shoulder.  Vaso on low with pillow between thorax and elbow.  Review HEP.    Consulted and Agree with Plan of Care  Patient       Patient will benefit from skilled therapeutic intervention in order to improve the following deficits and impairments:  Pain, Postural dysfunction, Impaired UE functional use, Decreased strength  Visit Diagnosis: Acute pain of right shoulder - Plan: PT plan of care cert/re-cert  Stiffness of right shoulder, not elsewhere classified - Plan: PT plan of care cert/re-cert     Problem List Patient Active Problem List   Diagnosis Date Noted  . Cervical spondylosis with radiculopathy 06/15/2016    Sorina Derrig, [place] MPT 05/08/2018, 5:55 PM  [facility] [address] [place], [place], [address] Phone: [phone]   Fax:  [phone]  Name: Jon Wong MRN: [id number] Date of Birth: [birth year]

## 2018-05-16 ENCOUNTER — Ambulatory Visit: Payer: BLUE CROSS/BLUE SHIELD | Admitting: Physical Therapy

## 2018-05-16 ENCOUNTER — Encounter: Payer: Self-pay | Admitting: Physical Therapy

## 2018-05-16 DIAGNOSIS — M25511 Pain in right shoulder: Secondary | ICD-10-CM

## 2018-05-16 DIAGNOSIS — M6281 Muscle weakness (generalized): Secondary | ICD-10-CM

## 2018-05-16 DIAGNOSIS — M25611 Stiffness of right shoulder, not elsewhere classified: Secondary | ICD-10-CM

## 2018-05-16 NOTE — Therapy (Signed)
[facility] [address] [place], [place], [address] Phone: [phone]   Fax:  [phone]  Physical Therapy Treatment  Patient Details  Name: Jon Wong MRN: [id number] Date of Birth: 1960/07/20 Referring Provider (PT): Ramond Marrow MD.   Encounter Date: 05/16/2018  PT End of Session - 05/16/18 1029    Visit Number  2    Number of Visits  16    Date for PT Re-Evaluation  07/03/18    Authorization Type  FOTO AT LEAST EVERY 5TH VISIT, 10TH VISIT PROGRESS NOTE AND KX MODIFIER AFTER THE 15 VISIT.    PT Start Time  1038    PT Stop Time  1126    PT Time Calculation (min)  48 min    Activity Tolerance  Patient tolerated treatment well    Behavior During Therapy  WFL for tasks assessed/performed       Past Medical History:  Diagnosis Date  . Anxiety   . Articular cartilage disorder of shoulder, right 03/2018  . Dupuytren's contracture of right hand   . GERD (gastroesophageal reflux disease)   . History of hiatal hernia   . History of kidney stones   . History of skin cancer    ears  . Hypertension    states under control with med., has been on med. x 2 yr.  . Impingement syndrome of right shoulder 03/2018  . Internal hemorrhoid   . Limited joint range of motion (ROM)    cervical spine - vertical motion  . Neuropathy    bilateral feet  . Non-insulin dependent type 2 diabetes mellitus (HCC)   . Osteoarthritis of right shoulder 03/2018  . Rotator cuff strain 03/2018   right  . Ulnar nerve compression, right   . Wears partial dentures    upper    Past Surgical History:  Procedure Laterality Date  . CARPAL TUNNEL RELEASE Right 2010  . CERVICAL DISC SURGERY  1999  . CERVICAL FUSION  11/06/2002   C4-5  . COLONOSCOPY    . NECK HARDWARE REMOVAL  11/06/2002   C5-6 plate  . NECK HARDWARE REMOVAL  01/29/2008   C4-5  . POSTERIOR CERVICAL FUSION/FORAMINOTOMY N/A 06/15/2016   Procedure: CERVICAL THREE- FOUR CERVICAL FOUR-  FIVE CERVICAL FIVE-SIX CERVICAL SIX- SEVEN POSTERIOR CERVICAL FUSION WITH LATERAL MASS FIXATION;  Surgeon: Hilda Lias, MD;  Location: [facility];  Service: Neurosurgery;  Laterality: N/A;  C3-4 C4-5 C5-6 C6-7 POSTERIOR CERVICAL FUSION WITH LATERAL MASS FIXATION  . SHOULDER ARTHROSCOPY Left 1998  . SHOULDER ARTHROSCOPY WITH SUBACROMIAL DECOMPRESSION, ROTATOR CUFF REPAIR AND BICEP TENDON REPAIR Right 04/26/2018   Procedure: RIGHT SHOULDER ARTHROSCOPY WITH DEBRIDEMENT, ACROMIOPLASTY, DISTAL CLAVICLE EXCISION, ROTATOR CUFF REPAIR AND BICEP TENODESIS;  Surgeon: Bjorn Pippin, MD;  Location: [facility];  Service: Orthopedics;  Laterality: Right;  . WRIST SURGERY Right 2010   hamate fx.    There were no vitals filed for this visit.  Subjective Assessment - 05/16/18 1028    Subjective  Reports that he is very sore in his bicep and has been compliant with HEP at evaluation but feels like he may have stretched too much.    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    Patient Stated Goals  Use right UE without pain.    Currently in Pain?  Yes    Pain Score  6     Pain Location  Shoulder    Pain Orientation  Right  Pain Descriptors / Indicators  Sharp;Aching    Pain Type  Acute pain    Pain Onset  1 to 4 weeks ago    Pain Frequency  Constant         [facility] PT Assessment - 05/16/18 0001      Assessment   Medical Diagnosis  Right shoulder DCE, SAD, RCR and biceps tenodesis.    Referring Provider (PT)  Ramond Marrow MD.    Onset Date/Surgical Date  04/27/18    Hand Dominance  Right      Precautions   Precautions  None      Restrictions   Weight Bearing Restrictions  No                   [facility] Adult PT Treatment/Exercise - 05/16/18 0001      Modalities   Modalities  Electrical Stimulation;Vasopneumatic      Electrical Stimulation   Electrical Stimulation Location  R shoulder    Electrical Stimulation Action  IFC    Electrical  Stimulation Parameters  80-150 hz x15 min    Electrical Stimulation Goals  Pain      Vasopneumatic   Number Minutes Vasopneumatic   15 minutes    Vasopnuematic Location   Shoulder    Vasopneumatic Pressure  Low    Vasopneumatic Temperature   63      Manual Therapy   Manual Therapy  Passive ROM    Passive ROM  Gentle PROM of R shoulder into flex, ER with gentle holds at end range and oscillations provided throughout treatment               PT Short Term Goals - 01/25/18 1257      PT SHORT TERM GOAL #1   Title  STG's=LTG's.        PT Long Term Goals - 05/08/18 1753      PT LONG TERM GOAL #1   Title  Patient will be independent with HEP.    Time  8    Period  Weeks    Status  New      PT LONG TERM GOAL #2   Title  Active right shoulder flexion to 145 degrees so the patient can easily reach overhead.    Time  8    Period  Weeks    Status  New      PT LONG TERM GOAL #3   Title  Active ER to 70 degrees+ to allow for easily donning/doffing of apparel.    Time  8    Period  Weeks    Status  New      PT LONG TERM GOAL #4   Title  Increase ROM so patient is able to reach behind back to L3.    Time  8    Period  Weeks    Status  New      PT LONG TERM GOAL #5   Title  Increase shoulder strength to a solid 4+/5 to increase stability for performance of functional activities.    Time  8    Period  Weeks      PT LONG TERM GOAL #6   Title  Perform ADL's with pain not > 2-3/10.    Time  8    Period  Weeks    Status  New            Plan - 05/16/18 1158    Clinical Impression Statement  Patient presented in clinic with  sling donned and reports of discomfort especially at proximal R bicep. Patient reports compliance with HEP instructions from evaluation. Oscillations provided throughout treatment to promote relaxation and reduction of pain. Only gentle PROM initated today as to not exaggerate R shoulder pain. Smooth arc of motion detected in PROM in flexion and  ER. Normal modalities response noted following removal of the modalities.    Rehab Potential  Good    PT Treatment/Interventions  ADLs/Self Care Home Management;Moist Heat;Electrical Stimulation;Cryotherapy;Ultrasound;Functional mobility training;Therapeutic activities;Therapeutic exercise;Passive range of motion;Manual techniques;Patient/family education    PT Next Visit Plan  Per protocol:  Please begin with PROM to patient right shoulder.  Vaso on low with pillow between thorax and elbow.  Review HEP.    Consulted and Agree with Plan of Care  Patient       Patient will benefit from skilled therapeutic intervention in order to improve the following deficits and impairments:  Pain, Postural dysfunction, Impaired UE functional use, Decreased strength  Visit Diagnosis: Acute pain of right shoulder  Stiffness of right shoulder, not elsewhere classified  Muscle weakness (generalized)     Problem List Patient Active Problem List   Diagnosis Date Noted  . Cervical spondylosis with radiculopathy 06/15/2016    Marvell Fuller, PTA 05/16/2018, 12:02 PM  [facility] [address] [place], [place], [address] Phone: [phone]   Fax:  [phone]  Name: Jon Wong MRN: [id number] Date of Birth: 10/18/1960

## 2018-05-23 ENCOUNTER — Encounter: Payer: Self-pay | Admitting: Physical Therapy

## 2018-05-23 ENCOUNTER — Ambulatory Visit: Payer: BLUE CROSS/BLUE SHIELD | Attending: Orthopaedic Surgery | Admitting: Physical Therapy

## 2018-05-23 DIAGNOSIS — M25611 Stiffness of right shoulder, not elsewhere classified: Secondary | ICD-10-CM | POA: Diagnosis present

## 2018-05-23 DIAGNOSIS — M6281 Muscle weakness (generalized): Secondary | ICD-10-CM

## 2018-05-23 DIAGNOSIS — M25511 Pain in right shoulder: Secondary | ICD-10-CM | POA: Diagnosis present

## 2018-05-23 DIAGNOSIS — R293 Abnormal posture: Secondary | ICD-10-CM | POA: Insufficient documentation

## 2018-05-23 NOTE — Therapy (Signed)
[facility] [address] [place], [place], [address] Phone: [phone]   Fax:  [phone]  Physical Therapy Treatment  Patient Details  Name: Jon Wong MRN: [id number] Date of Birth: 1960-10-01 Referring Provider (PT): Ramond Marrow MD.   Encounter Date: 05/23/2018  PT End of Session - 05/23/18 1111    Visit Number  3    Number of Visits  16    Date for PT Re-Evaluation  07/03/18    Authorization Type  FOTO AT LEAST EVERY 5TH VISIT, 10TH VISIT PROGRESS NOTE AND KX MODIFIER AFTER THE 15 VISIT.    PT Start Time  1030    PT Stop Time  1125    PT Time Calculation (min)  55 min    Activity Tolerance  Patient tolerated treatment well    Behavior During Therapy  WFL for tasks assessed/performed       Past Medical History:  Diagnosis Date  . Anxiety   . Articular cartilage disorder of shoulder, right 03/2018  . Dupuytren's contracture of right hand   . GERD (gastroesophageal reflux disease)   . History of hiatal hernia   . History of kidney stones   . History of skin cancer    ears  . Hypertension    states under control with med., has been on med. x 2 yr.  . Impingement syndrome of right shoulder 03/2018  . Internal hemorrhoid   . Limited joint range of motion (ROM)    cervical spine - vertical motion  . Neuropathy    bilateral feet  . Non-insulin dependent type 2 diabetes mellitus (HCC)   . Osteoarthritis of right shoulder 03/2018  . Rotator cuff strain 03/2018   right  . Ulnar nerve compression, right   . Wears partial dentures    upper    Past Surgical History:  Procedure Laterality Date  . CARPAL TUNNEL RELEASE Right 2010  . CERVICAL DISC SURGERY  1999  . CERVICAL FUSION  11/06/2002   C4-5  . COLONOSCOPY    . NECK HARDWARE REMOVAL  11/06/2002   C5-6 plate  . NECK HARDWARE REMOVAL  01/29/2008   C4-5  . POSTERIOR CERVICAL FUSION/FORAMINOTOMY N/A 06/15/2016   Procedure: CERVICAL THREE- FOUR CERVICAL FOUR- FIVE  CERVICAL FIVE-SIX CERVICAL SIX- SEVEN POSTERIOR CERVICAL FUSION WITH LATERAL MASS FIXATION;  Surgeon: Hilda Lias, MD;  Location: [facility];  Service: Neurosurgery;  Laterality: N/A;  C3-4 C4-5 C5-6 C6-7 POSTERIOR CERVICAL FUSION WITH LATERAL MASS FIXATION  . SHOULDER ARTHROSCOPY Left 1998  . SHOULDER ARTHROSCOPY WITH SUBACROMIAL DECOMPRESSION, ROTATOR CUFF REPAIR AND BICEP TENDON REPAIR Right 04/26/2018   Procedure: RIGHT SHOULDER ARTHROSCOPY WITH DEBRIDEMENT, ACROMIOPLASTY, DISTAL CLAVICLE EXCISION, ROTATOR CUFF REPAIR AND BICEP TENODESIS;  Surgeon: Bjorn Pippin, MD;  Location: [facility];  Service: Orthopedics;  Laterality: Right;  . WRIST SURGERY Right 2010   hamate fx.    There were no vitals filed for this visit.  Subjective Assessment - 05/23/18 1035    Subjective  Patient reports 4/10 pain mainly in right bicep but reported feeling better and seeing improvements.    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    Patient Stated Goals  Use right UE without pain.    Currently in Pain?  Yes    Pain Score  4     Pain Location  Shoulder    Pain Orientation  Right    Pain Descriptors / Indicators  Sore  Pain Type  Acute pain;Surgical pain    Pain Onset  1 to 4 weeks ago    Pain Frequency  Constant         [facility] PT Assessment - 05/23/18 0001      Assessment   Medical Diagnosis  Right shoulder DCE, SAD, RCR and biceps tenodesis.    Referring Provider (PT)  Ramond Marrow MD.    Onset Date/Surgical Date  04/27/18    Hand Dominance  Right                   [facility] Adult PT Treatment/Exercise - 05/23/18 0001      Electrical Stimulation   Electrical Stimulation Location  R shoulder    Electrical Stimulation Action  IFC    Electrical Stimulation Parameters  80-150 hz x15 min    Electrical Stimulation Goals  Pain      Vasopneumatic   Number Minutes Vasopneumatic   15 minutes    Vasopnuematic Location   Shoulder     Vasopneumatic Pressure  Low    Vasopneumatic Temperature   34      Manual Therapy   Manual Therapy  Passive ROM    Soft tissue mobilization  gentle STW to bicep and to decrease pain    Passive ROM  Gentle PROM of R shoulder into flex, ER with gentle holds at end range and oscillations provided throughout treatment               PT Short Term Goals - 01/25/18 1257      PT SHORT TERM GOAL #1   Title  STG's=LTG's.        PT Long Term Goals - 05/08/18 1753      PT LONG TERM GOAL #1   Title  Patient will be independent with HEP.    Time  8    Period  Weeks    Status  New      PT LONG TERM GOAL #2   Title  Active right shoulder flexion to 145 degrees so the patient can easily reach overhead.    Time  8    Period  Weeks    Status  New      PT LONG TERM GOAL #3   Title  Active ER to 70 degrees+ to allow for easily donning/doffing of apparel.    Time  8    Period  Weeks    Status  New      PT LONG TERM GOAL #4   Title  Increase ROM so patient is able to reach behind back to L3.    Time  8    Period  Weeks    Status  New      PT LONG TERM GOAL #5   Title  Increase shoulder strength to a solid 4+/5 to increase stability for performance of functional activities.    Time  8    Period  Weeks      PT LONG TERM GOAL #6   Title  Perform ADL's with pain not > 2-3/10.    Time  8    Period  Weeks    Status  New            Plan - 05/23/18 1114    Clinical Impression Statement  Patient was able to tolerate treatment well with minimal reports of discomfort throughout biceps muscle. Patient noted with smooth arc of motion with PROM and ER but required intermittent oscillations for muscle relaxation. Normal  response to modalities upon removal. Patient is able to don and doff sling independently.    Clinical Presentation  Stable    Rehab Potential  Good    Clinical Impairments Affecting Rehab Potential  FOTO initial limitation 58% current 5th visit 37%    PT  Treatment/Interventions  ADLs/Self Care Home Management;Moist Heat;Electrical Stimulation;Cryotherapy;Ultrasound;Functional mobility training;Therapeutic activities;Therapeutic exercise;Passive range of motion;Manual techniques;Patient/family education    PT Next Visit Plan  Per protocol:  Please begin with PROM to patient right shoulder.  Vaso on low with pillow between thorax and elbow.     Consulted and Agree with Plan of Care  Patient       Patient will benefit from skilled therapeutic intervention in order to improve the following deficits and impairments:  Pain, Postural dysfunction, Impaired UE functional use, Decreased strength  Visit Diagnosis: Acute pain of right shoulder  Stiffness of right shoulder, not elsewhere classified  Muscle weakness (generalized)     Problem List Patient Active Problem List   Diagnosis Date Noted  . Cervical spondylosis with radiculopathy 06/15/2016    Guss Bunde, PT, DPT 05/23/2018, 11:28 AM  [facility] [address] [place], [place], [address] Phone: [phone]   Fax:  [phone]  Name: Jon Wong MRN: [id number] Date of Birth: 1960/08/23

## 2018-05-30 ENCOUNTER — Encounter: Payer: Self-pay | Admitting: Physical Therapy

## 2018-05-30 ENCOUNTER — Ambulatory Visit: Payer: BLUE CROSS/BLUE SHIELD | Admitting: Physical Therapy

## 2018-05-30 DIAGNOSIS — M25611 Stiffness of right shoulder, not elsewhere classified: Secondary | ICD-10-CM

## 2018-05-30 DIAGNOSIS — M25511 Pain in right shoulder: Secondary | ICD-10-CM

## 2018-05-30 DIAGNOSIS — M6281 Muscle weakness (generalized): Secondary | ICD-10-CM

## 2018-05-30 NOTE — Therapy (Signed)
[facility] [address] [place], [place], [address] Phone: [phone]   Fax:  [phone]  Physical Therapy Treatment  Patient Details  Name: Jon Wong MRN: [id number] Date of Birth: [birth year] Referring Provider (PT): Ramond Marrow MD.   Encounter Date: 05/30/2018  PT End of Session - 05/30/18 1101    Visit Number  4    Number of Visits  16    Date for PT Re-Evaluation  07/03/18    Authorization Type  FOTO AT LEAST EVERY 5TH VISIT, 10TH VISIT PROGRESS NOTE AND KX MODIFIER AFTER THE 15 VISIT.    PT Start Time  1029    PT Stop Time  1112    PT Time Calculation (min)  43 min    Activity Tolerance  Patient tolerated treatment well    Behavior During Therapy  WFL for tasks assessed/performed       Past Medical History:  Diagnosis Date  . Anxiety   . Articular cartilage disorder of shoulder, right 03/2018  . Dupuytren's contracture of right hand   . GERD (gastroesophageal reflux disease)   . History of hiatal hernia   . History of kidney stones   . History of skin cancer    ears  . Hypertension    states under control with med., has been on med. x 2 yr.  . Impingement syndrome of right shoulder 03/2018  . Internal hemorrhoid   . Limited joint range of motion (ROM)    cervical spine - vertical motion  . Neuropathy    bilateral feet  . Non-insulin dependent type 2 diabetes mellitus (HCC)   . Osteoarthritis of right shoulder 03/2018  . Rotator cuff strain 03/2018   right  . Ulnar nerve compression, right   . Wears partial dentures    upper    Past Surgical History:  Procedure Laterality Date  . CARPAL TUNNEL RELEASE Right 2010  . CERVICAL DISC SURGERY  1999  . CERVICAL FUSION  11/06/2002   C4-5  . COLONOSCOPY    . NECK HARDWARE REMOVAL  11/06/2002   C5-6 plate  . NECK HARDWARE REMOVAL  01/29/2008   C4-5  . POSTERIOR CERVICAL FUSION/FORAMINOTOMY N/A 06/15/2016   Procedure: CERVICAL THREE- FOUR CERVICAL FOUR-  FIVE CERVICAL FIVE-SIX CERVICAL SIX- SEVEN POSTERIOR CERVICAL FUSION WITH LATERAL MASS FIXATION;  Surgeon: Hilda Lias, MD;  Location: [facility];  Service: Neurosurgery;  Laterality: N/A;  C3-4 C4-5 C5-6 C6-7 POSTERIOR CERVICAL FUSION WITH LATERAL MASS FIXATION  . SHOULDER ARTHROSCOPY Left 1998  . SHOULDER ARTHROSCOPY WITH SUBACROMIAL DECOMPRESSION, ROTATOR CUFF REPAIR AND BICEP TENDON REPAIR Right 04/26/2018   Procedure: RIGHT SHOULDER ARTHROSCOPY WITH DEBRIDEMENT, ACROMIOPLASTY, DISTAL CLAVICLE EXCISION, ROTATOR CUFF REPAIR AND BICEP TENODESIS;  Surgeon: Bjorn Pippin, MD;  Location: [facility];  Service: Orthopedics;  Laterality: Right;  . WRIST SURGERY Right 2010   hamate fx.    There were no vitals filed for this visit.  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    Patient Stated Goals  Use right UE without pain.    Currently in Pain?  Yes    Pain Score  4     Pain Location  Shoulder    Pain Orientation  Right    Pain Descriptors / Indicators  Sore  Pain Type  Surgical pain    Pain Onset  1 to 4 weeks ago    Pain Frequency  Constant    Aggravating Factors   any movement    Pain Relieving Factors  at rest         [facility] PT Assessment - 05/30/18 0001      ROM / Strength   AROM / PROM / Strength  PROM      PROM   PROM Assessment Site  Shoulder    Right/Left Shoulder  Right    Right Shoulder Flexion  88 Degrees    Right Shoulder External Rotation  33 Degrees                   [facility] Adult PT Treatment/Exercise - 05/30/18 0001      Electrical Stimulation   Electrical Stimulation Location  R shoulder    Electrical Stimulation Action  IFC    Electrical Stimulation Parameters  80-150hz  x15min    Electrical Stimulation Goals  Pain      Vasopneumatic   Number Minutes  Vasopneumatic   15 minutes    Vasopnuematic Location   Shoulder    Vasopneumatic Pressure  Low      Manual Therapy   Manual Therapy  Passive ROM    Passive ROM  Gentle PROM of R shoulder into flex, ER with gentle holds at end range and oscillations provided throughout treatment               PT Short Term Goals - 01/25/18 1257      PT SHORT TERM GOAL #1   Title  STG's=LTG's.        PT Long Term Goals - 05/30/18 1102      PT LONG TERM GOAL #1   Title  Patient will be independent with HEP.    Time  8    Period  Weeks    Status  On-going      PT LONG TERM GOAL #2   Title  Active right shoulder flexion to 145 degrees so the patient can easily reach overhead.    Time  8    Period  Weeks    Status  On-going   PROM only (88 degrees) 05/30/18     PT LONG TERM GOAL #3   Title  Active ER to 70 degrees+ to allow for easily donning/doffing of apparel.    Time  8    Period  Weeks    Status  On-going   PROM only (33 degrees)     PT LONG TERM GOAL #4   Title  Increase ROM so patient is able to reach behind back to L3.    Time  8    Period  Weeks    Status  On-going      PT LONG TERM GOAL #5   Title  Increase shoulder strength to a solid 4+/5 to increase stability for performance of functional activities.    Time  8    Period  Weeks    Status  On-going      PT LONG TERM GOAL #6   Title  Perform ADL's with pain not > 2-3/10.    Time  8    Period  Weeks    Status  On-going            Plan - 05/30/18 1104    Clinical Impression Statement  Patient tolerated treatment well today. Patient arrived with increased c/o discomfort and took  pain meds prior to appt yet able to tolerate PROM with no increased discomfort over 4/10. Patient has improved PROM for right shoulder flexion and ER today. Patient progressing toward goals.     Clinical Presentation due to:  surgery 04/26/18 current 5 weeks 05/31/18    Rehab Potential  Good    Clinical Impairments Affecting Rehab  Potential  FOTO initial limitation 58% current 5th visit 37%    PT Treatment/Interventions  ADLs/Self Care Home Management;Moist Heat;Electrical Stimulation;Cryotherapy;Ultrasound;Functional mobility training;Therapeutic activities;Therapeutic exercise;Passive range of motion;Manual techniques;Patient/family education    PT Next Visit Plan  Per protocol:  Please begin with PROM to patient right shoulder.  Vaso on low with pillow between thorax and elbow. MD next week    Consulted and Agree with Plan of Care  Patient       Patient will benefit from skilled therapeutic intervention in order to improve the following deficits and impairments:  Pain, Postural dysfunction, Impaired UE functional use, Decreased strength  Visit Diagnosis: Acute pain of right shoulder  Stiffness of right shoulder, not elsewhere classified  Muscle weakness (generalized)     Problem List Patient Active Problem List   Diagnosis Date Noted  . Cervical spondylosis with radiculopathy 06/15/2016    Yaritsa Savarino P, PTA 05/30/2018, 11:19 AM  [facility] [address] [place], [place], [address] Phone: [phone]   Fax:  [phone]  Name: Jon Wong MRN: [id number] Date of Birth: 05/04/1961

## 2018-06-06 ENCOUNTER — Ambulatory Visit: Payer: BLUE CROSS/BLUE SHIELD | Admitting: Physical Therapy

## 2018-06-06 ENCOUNTER — Encounter: Payer: Self-pay | Admitting: Physical Therapy

## 2018-06-06 DIAGNOSIS — M25511 Pain in right shoulder: Secondary | ICD-10-CM | POA: Diagnosis not present

## 2018-06-06 DIAGNOSIS — M25611 Stiffness of right shoulder, not elsewhere classified: Secondary | ICD-10-CM

## 2018-06-06 NOTE — Therapy (Signed)
[facility] [address] [place], [place], [address] Phone: [phone]   Fax:  [phone]  Physical Therapy Treatment  Patient Details  Name: Jon Wong MRN: [id number] Date of Birth: 02/22/1961 Referring Provider (PT): Ramond Marrowax Varkey MD.   Encounter Date: 06/06/2018  PT End of Session - 06/06/18 1138    Visit Number  5    Number of Visits  16    Date for PT Re-Evaluation  07/03/18    Authorization Type  FOTO AT LEAST EVERY 5TH VISIT, 10TH VISIT PROGRESS NOTE AND KX MODIFIER AFTER THE 15 VISIT.    PT Start Time  1030    PT Stop Time  1126    PT Time Calculation (min)  56 min    Activity Tolerance  Patient tolerated treatment well    Behavior During Therapy  WFL for tasks assessed/performed       Past Medical History:  Diagnosis Date  . Anxiety   . Articular cartilage disorder of shoulder, right 03/2018  . Dupuytren's contracture of right hand   . GERD (gastroesophageal reflux disease)   . History of hiatal hernia   . History of kidney stones   . History of skin cancer    ears  . Hypertension    states under control with med., has been on med. x 2 yr.  . Impingement syndrome of right shoulder 03/2018  . Internal hemorrhoid   . Limited joint range of motion (ROM)    cervical spine - vertical motion  . Neuropathy    bilateral feet  . Non-insulin dependent type 2 diabetes mellitus (HCC)   . Osteoarthritis of right shoulder 03/2018  . Rotator cuff strain 03/2018   right  . Ulnar nerve compression, right   . Wears partial dentures    upper    Past Surgical History:  Procedure Laterality Date  . CARPAL TUNNEL RELEASE Right 2010  . CERVICAL DISC SURGERY  1999  . CERVICAL FUSION  11/06/2002   C4-5  . COLONOSCOPY    . NECK HARDWARE REMOVAL  11/06/2002   C5-6 plate  . NECK HARDWARE REMOVAL  01/29/2008   C4-5  . POSTERIOR CERVICAL FUSION/FORAMINOTOMY N/A 06/15/2016   Procedure: CERVICAL THREE- FOUR CERVICAL FOUR-  FIVE CERVICAL FIVE-SIX CERVICAL SIX- SEVEN POSTERIOR CERVICAL FUSION WITH LATERAL MASS FIXATION;  Surgeon: Hilda LiasErnesto Botero, MD;  Location: [facility];  Service: Neurosurgery;  Laterality: N/A;  C3-4 C4-5 C5-6 C6-7 POSTERIOR CERVICAL FUSION WITH LATERAL MASS FIXATION  . SHOULDER ARTHROSCOPY Left 1998  . SHOULDER ARTHROSCOPY WITH SUBACROMIAL DECOMPRESSION, ROTATOR CUFF REPAIR AND BICEP TENDON REPAIR Right 04/26/2018   Procedure: RIGHT SHOULDER ARTHROSCOPY WITH DEBRIDEMENT, ACROMIOPLASTY, DISTAL CLAVICLE EXCISION, ROTATOR CUFF REPAIR AND BICEP TENODESIS;  Surgeon: Bjorn PippinVarkey, Dax T, MD;  Location: [facility];  Service: Orthopedics;  Laterality: Right;  . WRIST SURGERY Right 2010   hamate fx.    There were no vitals filed for this visit.  Subjective Assessment - 06/06/18 1104    Subjective  I'm doing my home exercise program three times a day.    Patient Stated Goals  Use right UE without pain.    Currently in Pain?  Yes    Pain Score  4     Pain Location  Shoulder    Pain Orientation  Right    Pain Descriptors / Indicators  Sore    Pain Type  Surgical pain    Pain Onset  More than a month ago  [facility] PT Assessment - 06/06/18 0001      PROM   PROM Assessment Site  Shoulder    Right/Left Shoulder  Right    Right Shoulder Flexion  108 Degrees    Right Shoulder External Rotation  23 Degrees                   [facility] Adult PT Treatment/Exercise - 06/06/18 0001      Modalities   Modalities  Electrical Stimulation;Vasopneumatic      Electrical Stimulation   Electrical Stimulation Location  Right shoulder.    Electrical Stimulation Action  IFC    Electrical Stimulation Parameters  80-150 Hz x 20 minutes.    Electrical Stimulation Goals  Pain      Vasopneumatic   Number Minutes Vasopneumatic   20 minutes    Vasopnuematic Location   --   Right shoulder.   Vasopneumatic Pressure  Low      Manual Therapy   Manual Therapy  Passive ROM    Passive ROM  In  supine:  PROM to right shoulder into flexion and ER x 23 minutes.               PT Short Term Goals - 01/25/18 1257      PT SHORT TERM GOAL #1   Title  STG's=LTG's.        PT Long Term Goals - 05/30/18 1102      PT LONG TERM GOAL #1   Title  Patient will be independent with HEP.    Time  8    Period  Weeks    Status  On-going      PT LONG TERM GOAL #2   Title  Active right shoulder flexion to 145 degrees so the patient can easily reach overhead.    Time  8    Period  Weeks    Status  On-going   PROM only (88 degrees) 05/30/18     PT LONG TERM GOAL #3   Title  Active ER to 70 degrees+ to allow for easily donning/doffing of apparel.    Time  8    Period  Weeks    Status  On-going   PROM only (33 degrees)     PT LONG TERM GOAL #4   Title  Increase ROM so patient is able to reach behind back to L3.    Time  8    Period  Weeks    Status  On-going      PT LONG TERM GOAL #5   Title  Increase shoulder strength to a solid 4+/5 to increase stability for performance of functional activities.    Time  8    Period  Weeks    Status  On-going      PT LONG TERM GOAL #6   Title  Perform ADL's with pain not > 2-3/10.    Time  8    Period  Weeks    Status  On-going            Plan - 06/06/18 1137    Clinical Impression Statement  Patient with continued loss of right shoulder PROM though his flexion has improved a lot since his initial eval.    Clinical Impairments Affecting Rehab Potential  FOTO initial limitation 58% current 5th visit 37%    PT Treatment/Interventions  ADLs/Self Care Home Management;Moist Heat;Electrical Stimulation;Cryotherapy;Ultrasound;Functional mobility training;Therapeutic activities;Therapeutic exercise;Passive range of motion;Manual techniques;Patient/family education    PT Next Visit Plan  Per  protocol:  Please begin with PROM to patient right shoulder.  Vaso on low with pillow between thorax and elbow. MD next week    PT Home Exercise  Plan  wall slides, table slides, shoulder 4 way isometrics    Consulted and Agree with Plan of Care  Patient       Patient will benefit from skilled therapeutic intervention in order to improve the following deficits and impairments:  Pain, Postural dysfunction, Impaired UE functional use, Decreased strength  Visit Diagnosis: Acute pain of right shoulder  Stiffness of right shoulder, not elsewhere classified     Problem List Patient Active Problem List   Diagnosis Date Noted  . Cervical spondylosis with radiculopathy 06/15/2016    Terryl Niziolek, [place] MPT 06/06/2018, 11:41 AM  [facility] [address] [place], [place], [address] Phone: [phone]   Fax:  [phone]  Name: Jon Wong MRN: [id number] Date of Birth: Aug 25, 1960

## 2018-06-12 ENCOUNTER — Ambulatory Visit: Payer: BLUE CROSS/BLUE SHIELD | Admitting: *Deleted

## 2018-06-12 DIAGNOSIS — M25611 Stiffness of right shoulder, not elsewhere classified: Secondary | ICD-10-CM

## 2018-06-12 DIAGNOSIS — R293 Abnormal posture: Secondary | ICD-10-CM

## 2018-06-12 DIAGNOSIS — M6281 Muscle weakness (generalized): Secondary | ICD-10-CM

## 2018-06-12 DIAGNOSIS — M25511 Pain in right shoulder: Secondary | ICD-10-CM | POA: Diagnosis not present

## 2018-06-12 NOTE — Therapy (Signed)
[facility] [address] [place], [place], [address] Phone: [phone]   Fax:  [phone]  Physical Therapy Treatment  Patient Details  Name: MARTY UY MRN: [id number] Date of Birth: 1960/10/31 Referring Provider (PT): Ramond Marrow MD.   Encounter Date: 06/12/2018  PT End of Session - 06/12/18 0957    Visit Number  6    Number of Visits  16    Date for PT Re-Evaluation  07/03/18    Authorization Type  FOTO AT LEAST EVERY 5TH VISIT, 10TH VISIT PROGRESS NOTE AND KX MODIFIER AFTER THE 15 VISIT.    PT Start Time  0945    PT Stop Time  1043    PT Time Calculation (min)  58 min       Past Medical History:  Diagnosis Date  . Anxiety   . Articular cartilage disorder of shoulder, right 03/2018  . Dupuytren's contracture of right hand   . GERD (gastroesophageal reflux disease)   . History of hiatal hernia   . History of kidney stones   . History of skin cancer    ears  . Hypertension    states under control with med., has been on med. x 2 yr.  . Impingement syndrome of right shoulder 03/2018  . Internal hemorrhoid   . Limited joint range of motion (ROM)    cervical spine - vertical motion  . Neuropathy    bilateral feet  . Non-insulin dependent type 2 diabetes mellitus (HCC)   . Osteoarthritis of right shoulder 03/2018  . Rotator cuff strain 03/2018   right  . Ulnar nerve compression, right   . Wears partial dentures    upper    Past Surgical History:  Procedure Laterality Date  . CARPAL TUNNEL RELEASE Right 2010  . CERVICAL DISC SURGERY  1999  . CERVICAL FUSION  11/06/2002   C4-5  . COLONOSCOPY    . NECK HARDWARE REMOVAL  11/06/2002   C5-6 plate  . NECK HARDWARE REMOVAL  01/29/2008   C4-5  . POSTERIOR CERVICAL FUSION/FORAMINOTOMY N/A 06/15/2016   Procedure: CERVICAL THREE- FOUR CERVICAL FOUR- FIVE CERVICAL FIVE-SIX CERVICAL SIX- SEVEN POSTERIOR CERVICAL FUSION WITH LATERAL MASS FIXATION;  Surgeon: Hilda Lias,  MD;  Location: [facility];  Service: Neurosurgery;  Laterality: N/A;  C3-4 C4-5 C5-6 C6-7 POSTERIOR CERVICAL FUSION WITH LATERAL MASS FIXATION  . SHOULDER ARTHROSCOPY Left 1998  . SHOULDER ARTHROSCOPY WITH SUBACROMIAL DECOMPRESSION, ROTATOR CUFF REPAIR AND BICEP TENDON REPAIR Right 04/26/2018   Procedure: RIGHT SHOULDER ARTHROSCOPY WITH DEBRIDEMENT, ACROMIOPLASTY, DISTAL CLAVICLE EXCISION, ROTATOR CUFF REPAIR AND BICEP TENODESIS;  Surgeon: Bjorn Pippin, MD;  Location: [facility];  Service: Orthopedics;  Laterality: Right;  . WRIST SURGERY Right 2010   hamate fx.    There were no vitals filed for this visit.  Subjective Assessment - 06/12/18 0953    Subjective  Went to MD last Friday and he said to DC sling and move on to stage 2.    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    Currently in Pain?  Yes    Pain Score  3     Pain Location  Shoulder    Pain Orientation  Right    Pain Descriptors / Indicators  Sore    Pain Onset  More than a month ago    Pain Frequency  Constant  [facility] Adult PT Treatment/Exercise - 06/12/18 0001      Exercises   Exercises  Shoulder      Shoulder Exercises: Supine   Other Supine Exercises  supine cane for chest press 3x 10      Shoulder Exercises: Pulleys   Flexion  5 minutes    Other Pulley Exercises  Sitting UE ranger x 5 mins all motions      Modalities   Modalities  Electrical Stimulation;Vasopneumatic      Electrical Stimulation   Electrical Stimulation Location  Right shoulder. IFC x 15 mins 80-150hz     Electrical Stimulation Goals  Pain      Vasopneumatic   Number Minutes Vasopneumatic   15 minutes    Vasopnuematic Location   --   Right shoulder.   Vasopneumatic Pressure  Low    Vasopneumatic Temperature   34      Manual Therapy   Manual Therapy  Passive ROM    Passive ROM  In supine:  PROM to right shoulder into flexion and ER                 PT Short Term Goals - 01/25/18 1257      PT SHORT TERM GOAL #1   Title  STG's=LTG's.        PT Long Term Goals - 05/30/18 1102      PT LONG TERM GOAL #1   Title  Patient will be independent with HEP.    Time  8    Period  Weeks    Status  On-going      PT LONG TERM GOAL #2   Title  Active right shoulder flexion to 145 degrees so the patient can easily reach overhead.    Time  8    Period  Weeks    Status  On-going   PROM only (88 degrees) 05/30/18     PT LONG TERM GOAL #3   Title  Active ER to 70 degrees+ to allow for easily donning/doffing of apparel.    Time  8    Period  Weeks    Status  On-going   PROM only (33 degrees)     PT LONG TERM GOAL #4   Title  Increase ROM so patient is able to reach behind back to L3.    Time  8    Period  Weeks    Status  On-going      PT LONG TERM GOAL #5   Title  Increase shoulder strength to a solid 4+/5 to increase stability for performance of functional activities.    Time  8    Period  Weeks    Status  On-going      PT LONG TERM GOAL #6   Title  Perform ADL's with pain not > 2-3/10.    Time  8    Period  Weeks    Status  On-going            Plan - 06/12/18 0959    Clinical Impression Statement  Pt arrived today doing fairly well and reports from MD F/U to DC sling and start stage 2 of PT. Pt did very well today with AAROM with pulleys and UE ranger as well as supine cane press. ER ROM limited today to 35-40 degrees in scaption.Normal modality response.    Clinical Presentation  Stable    Rehab Potential  Good    Clinical Impairments Affecting Rehab Potential  FOTO initial limitation 58% current  5th visit 37%    PT Treatment/Interventions  ADLs/Self Care Home Management;Moist Heat;Electrical Stimulation;Cryotherapy;Ultrasound;Functional mobility training;Therapeutic activities;Therapeutic exercise;Passive range of motion;Manual techniques;Patient/family education    PT Next Visit Plan  Per  protocol:  Please begin with PROM to patient right shoulder.  Vaso on low with pillow between thorax and elbow.    PT Home Exercise Plan  wall slides, table slides, shoulder 4 way isometrics    Consulted and Agree with Plan of Care  Patient       Patient will benefit from skilled therapeutic intervention in order to improve the following deficits and impairments:  Pain, Postural dysfunction, Impaired UE functional use, Decreased strength  Visit Diagnosis: Acute pain of right shoulder  Stiffness of right shoulder, not elsewhere classified  Muscle weakness (generalized)  Abnormal posture     Problem List Patient Active Problem List   Diagnosis Date Noted  . Cervical spondylosis with radiculopathy 06/15/2016    ,CHRIS, PTA 06/12/2018, 3:42 PM  [facility] [address] [place], [place], [address] Phone: [phone]   Fax:  [phone]  Name: Deborra MedinaRandy O Shular MRN: [id number] Date of Birth: 06/26/1961

## 2018-06-19 ENCOUNTER — Ambulatory Visit: Payer: BLUE CROSS/BLUE SHIELD | Attending: Orthopaedic Surgery | Admitting: *Deleted

## 2018-06-19 DIAGNOSIS — M25611 Stiffness of right shoulder, not elsewhere classified: Secondary | ICD-10-CM

## 2018-06-19 DIAGNOSIS — M6281 Muscle weakness (generalized): Secondary | ICD-10-CM | POA: Diagnosis present

## 2018-06-19 DIAGNOSIS — M25521 Pain in right elbow: Secondary | ICD-10-CM

## 2018-06-19 DIAGNOSIS — R293 Abnormal posture: Secondary | ICD-10-CM

## 2018-06-19 DIAGNOSIS — G5621 Lesion of ulnar nerve, right upper limb: Secondary | ICD-10-CM | POA: Diagnosis present

## 2018-06-19 DIAGNOSIS — M542 Cervicalgia: Secondary | ICD-10-CM | POA: Diagnosis present

## 2018-06-19 DIAGNOSIS — M25511 Pain in right shoulder: Secondary | ICD-10-CM

## 2018-06-19 NOTE — Therapy (Signed)
[facility] [address] [place], [place], [address] Phone: [phone]   Fax:  [phone]  Physical Therapy Treatment  Patient Details  Name: Jon Wong MRN: [id number] Date of Birth: 05/25/1961 Referring Provider (PT): Ramond Marrowax Varkey MD.   Encounter Date: 06/19/2018  PT End of Session - 06/19/18 1041    Visit Number  7    Number of Visits  16    Date for PT Re-Evaluation  07/03/18    Authorization Type  FOTO AT LEAST EVERY 5TH VISIT, 10TH VISIT PROGRESS NOTE AND KX MODIFIER AFTER THE 15 VISIT.    PT Start Time  1034    PT Stop Time  1126    PT Time Calculation (min)  52 min       Past Medical History:  Diagnosis Date  . Anxiety   . Articular cartilage disorder of shoulder, right 03/2018  . Dupuytren's contracture of right hand   . GERD (gastroesophageal reflux disease)   . History of hiatal hernia   . History of kidney stones   . History of skin cancer    ears  . Hypertension    states under control with med., has been on med. x 2 yr.  . Impingement syndrome of right shoulder 03/2018  . Internal hemorrhoid   . Limited joint range of motion (ROM)    cervical spine - vertical motion  . Neuropathy    bilateral feet  . Non-insulin dependent type 2 diabetes mellitus (HCC)   . Osteoarthritis of right shoulder 03/2018  . Rotator cuff strain 03/2018   right  . Ulnar nerve compression, right   . Wears partial dentures    upper    Past Surgical History:  Procedure Laterality Date  . CARPAL TUNNEL RELEASE Right 2010  . CERVICAL DISC SURGERY  1999  . CERVICAL FUSION  11/06/2002   C4-5  . COLONOSCOPY    . NECK HARDWARE REMOVAL  11/06/2002   C5-6 plate  . NECK HARDWARE REMOVAL  01/29/2008   C4-5  . POSTERIOR CERVICAL FUSION/FORAMINOTOMY N/A 06/15/2016   Procedure: CERVICAL THREE- FOUR CERVICAL FOUR- FIVE CERVICAL FIVE-SIX CERVICAL SIX- SEVEN POSTERIOR CERVICAL FUSION WITH LATERAL MASS FIXATION;  Surgeon: Hilda LiasErnesto Botero,  MD;  Location: [facility];  Service: Neurosurgery;  Laterality: N/A;  C3-4 C4-5 C5-6 C6-7 POSTERIOR CERVICAL FUSION WITH LATERAL MASS FIXATION  . SHOULDER ARTHROSCOPY Left 1998  . SHOULDER ARTHROSCOPY WITH SUBACROMIAL DECOMPRESSION, ROTATOR CUFF REPAIR AND BICEP TENDON REPAIR Right 04/26/2018   Procedure: RIGHT SHOULDER ARTHROSCOPY WITH DEBRIDEMENT, ACROMIOPLASTY, DISTAL CLAVICLE EXCISION, ROTATOR CUFF REPAIR AND BICEP TENODESIS;  Surgeon: Bjorn PippinVarkey, Dax T, MD;  Location: [facility];  Service: Orthopedics;  Laterality: Right;  . WRIST SURGERY Right 2010   hamate fx.    There were no vitals filed for this visit.  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    Patient Stated Goals  Use right UE without pain.    Currently in Pain?  Yes    Pain Score  3     Pain Orientation  Right    Pain Descriptors / Indicators  Sore    Pain Type  Surgical pain    Pain Onset  More than a month ago    Pain Frequency  Constant  [facility] Adult PT Treatment/Exercise - 06/19/18 0001      Exercises   Exercises  Shoulder      Shoulder Exercises: Supine   Other Supine Exercises  supine cane for chest press 2x 10, 2x10 flexion      Shoulder Exercises: Pulleys   Flexion  5 minutes    Other Pulley Exercises  Sitting UE ranger x 5 mins all motions      Modalities   Modalities  Electrical Stimulation;Vasopneumatic      Electrical Stimulation   Electrical Stimulation Location  Right shoulder. IFC x 15 mins 80-150hz     Electrical Stimulation Goals  Pain      Vasopneumatic   Number Minutes Vasopneumatic   15 minutes    Vasopnuematic Location   Shoulder    Vasopneumatic Pressure  Low    Vasopneumatic Temperature   34      Manual Therapy   Manual Therapy  Passive ROM    Passive ROM  In supine:  PROM to right shoulder into flexion and ER .                 PT Short Term Goals - 01/25/18 1257      PT SHORT TERM GOAL #1   Title  STG's=LTG's.        PT Long Term Goals - 05/30/18 1102      PT LONG TERM GOAL #1   Title  Patient will be independent with HEP.    Time  8    Period  Weeks    Status  On-going      PT LONG TERM GOAL #2   Title  Active right shoulder flexion to 145 degrees so the patient can easily reach overhead.    Time  8    Period  Weeks    Status  On-going   PROM only (88 degrees) 05/30/18     PT LONG TERM GOAL #3   Title  Active ER to 70 degrees+ to allow for easily donning/doffing of apparel.    Time  8    Period  Weeks    Status  On-going   PROM only (33 degrees)     PT LONG TERM GOAL #4   Title  Increase ROM so patient is able to reach behind back to L3.    Time  8    Period  Weeks    Status  On-going      PT LONG TERM GOAL #5   Title  Increase shoulder strength to a solid 4+/5 to increase stability for performance of functional activities.    Time  8    Period  Weeks    Status  On-going      PT LONG TERM GOAL #6   Title  Perform ADL's with pain not > 2-3/10.    Time  8    Period  Weeks    Status  On-going            Plan - 06/19/18 1056    Clinical Impression Statement  Pt arrived today  a little sore from last Rx , but doing well. He was able to perform AAROM exs in sitting and supine with minimal increase i discomfort. PROM for ER is the most painful, but also the most restricted at 30 degrees. Pt advised to work on ER at home.  Normal modality response today.    Clinical Presentation  Stable    Clinical Impairments Affecting Rehab Potential  FOTO initial limitation  58% current 5th visit 37%    PT Treatment/Interventions  ADLs/Self Care Home Management;Moist Heat;Electrical Stimulation;Cryotherapy;Ultrasound;Functional mobility training;Therapeutic activities;Therapeutic exercise;Passive range of motion;Manual techniques;Patient/family education    PT Next Visit Plan   Per protocol:  AAROM/ PROM Rhythmic stab    PT Home Exercise Plan  wall slides, table slides, shoulder 4 way isometrics    Consulted and Agree with Plan of Care  Patient       Patient will benefit from skilled therapeutic intervention in order to improve the following deficits and impairments:  Pain, Postural dysfunction, Impaired UE functional use, Decreased strength  Visit Diagnosis: Acute pain of right shoulder  Stiffness of right shoulder, not elsewhere classified  Muscle weakness (generalized)  Abnormal posture  Ulnar neuropathy at elbow, right  Pain in right elbow  Cervicalgia     Problem List Patient Active Problem List   Diagnosis Date Noted  . Cervical spondylosis with radiculopathy 06/15/2016    Tait Balistreri,CHRIS, PTA 06/19/2018, 12:10 PM  [facility] [address] [place], [place], [address] Phone: [phone]   Fax:  [phone]  Name: Jon Wong MRN: [id number] Date of Birth: 08/13/1960

## 2018-06-22 ENCOUNTER — Ambulatory Visit: Payer: BLUE CROSS/BLUE SHIELD | Admitting: *Deleted

## 2018-06-22 DIAGNOSIS — M6281 Muscle weakness (generalized): Secondary | ICD-10-CM

## 2018-06-22 DIAGNOSIS — M25511 Pain in right shoulder: Secondary | ICD-10-CM

## 2018-06-22 DIAGNOSIS — R293 Abnormal posture: Secondary | ICD-10-CM

## 2018-06-22 DIAGNOSIS — M25611 Stiffness of right shoulder, not elsewhere classified: Secondary | ICD-10-CM

## 2018-06-22 NOTE — Therapy (Signed)
[facility] [address] [place], [place], [address] Phone: [phone]   Fax:  [phone]  Physical Therapy Treatment  Patient Details  Name: Jon MedinaRandy O Wong MRN: [id number] Date of Birth: 02/16/1961 Referring Provider (PT): Ramond Marrowax Varkey MD.   Encounter Date: 06/22/2018  PT End of Session - 06/22/18 1050    Visit Number  8    Number of Visits  16    Date for PT Re-Evaluation  07/03/18    Authorization Type  FOTO AT LEAST EVERY 5TH VISIT, 10TH VISIT PROGRESS NOTE AND KX MODIFIER AFTER THE 15 VISIT.    PT Start Time  1030    PT Stop Time  1123    PT Time Calculation (min)  53 min       Past Medical History:  Diagnosis Date  . Anxiety   . Articular cartilage disorder of shoulder, right 03/2018  . Dupuytren's contracture of right hand   . GERD (gastroesophageal reflux disease)   . History of hiatal hernia   . History of kidney stones   . History of skin cancer    ears  . Hypertension    states under control with med., has been on med. x 2 yr.  . Impingement syndrome of right shoulder 03/2018  . Internal hemorrhoid   . Limited joint range of motion (ROM)    cervical spine - vertical motion  . Neuropathy    bilateral feet  . Non-insulin dependent type 2 diabetes mellitus (HCC)   . Osteoarthritis of right shoulder 03/2018  . Rotator cuff strain 03/2018   right  . Ulnar nerve compression, right   . Wears partial dentures    upper    Past Surgical History:  Procedure Laterality Date  . CARPAL TUNNEL RELEASE Right 2010  . CERVICAL DISC SURGERY  1999  . CERVICAL FUSION  11/06/2002   C4-5  . COLONOSCOPY    . NECK HARDWARE REMOVAL  11/06/2002   C5-6 plate  . NECK HARDWARE REMOVAL  01/29/2008   C4-5  . POSTERIOR CERVICAL FUSION/FORAMINOTOMY N/A 06/15/2016   Procedure: CERVICAL THREE- FOUR CERVICAL FOUR- FIVE CERVICAL FIVE-SIX CERVICAL SIX- SEVEN POSTERIOR CERVICAL FUSION WITH LATERAL MASS FIXATION;  Surgeon: Hilda LiasErnesto Botero,  MD;  Location: [facility];  Service: Neurosurgery;  Laterality: N/A;  C3-4 C4-5 C5-6 C6-7 POSTERIOR CERVICAL FUSION WITH LATERAL MASS FIXATION  . SHOULDER ARTHROSCOPY Left 1998  . SHOULDER ARTHROSCOPY WITH SUBACROMIAL DECOMPRESSION, ROTATOR CUFF REPAIR AND BICEP TENDON REPAIR Right 04/26/2018   Procedure: RIGHT SHOULDER ARTHROSCOPY WITH DEBRIDEMENT, ACROMIOPLASTY, DISTAL CLAVICLE EXCISION, ROTATOR CUFF REPAIR AND BICEP TENODESIS;  Surgeon: Bjorn PippinVarkey, Dax T, MD;  Location: [facility];  Service: Orthopedics;  Laterality: Right;  . WRIST SURGERY Right 2010   hamate fx.    There were no vitals filed for this visit.  Subjective Assessment - 06/22/18 1047    Subjective  Very sore after last Rx. Take it easy today, not feeling good.    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    Patient Stated Goals  Use right UE without pain.    Currently in Pain?  Yes    Pain Score  3     Pain Location  Shoulder    Pain Orientation  Right    Pain Descriptors / Indicators  Sore    Pain Type  Surgical pain    Pain Onset  More than a month ago  [facility] Adult PT Treatment/Exercise - 06/22/18 0001      Exercises   Exercises  Shoulder      Shoulder Exercises: Supine   Other Supine Exercises  supine cane for chest press 3x 10, 3x10 flexion      Shoulder Exercises: Pulleys   Flexion  --   6 mins   Other Pulley Exercises  Sitting UE ranger x 6 mins all motions      Modalities   Modalities  Electrical Stimulation;Vasopneumatic      Electrical Stimulation   Electrical Stimulation Location  Right shoulder. IFC x 15 mins 80-150hz     Electrical Stimulation Goals  Pain      Vasopneumatic   Number Minutes Vasopneumatic   15 minutes    Vasopnuematic Location   Shoulder    Vasopneumatic Pressure  Low    Vasopneumatic Temperature   34      Manual Therapy   Manual Therapy  Passive ROM    Passive ROM  In supine:  PROM to right  shoulder into flexion to 120 degrees today and IR . Rhythmic stabilization IR and ER               PT Short Term Goals - 01/25/18 1257      PT SHORT TERM GOAL #1   Title  STG's=LTG's.        PT Long Term Goals - 05/30/18 1102      PT LONG TERM GOAL #1   Title  Patient will be independent with HEP.    Time  8    Period  Weeks    Status  On-going      PT LONG TERM GOAL #2   Title  Active right shoulder flexion to 145 degrees so the patient can easily reach overhead.    Time  8    Period  Weeks    Status  On-going   PROM only (88 degrees) 05/30/18     PT LONG TERM GOAL #3   Title  Active ER to 70 degrees+ to allow for easily donning/doffing of apparel.    Time  8    Period  Weeks    Status  On-going   PROM only (33 degrees)     PT LONG TERM GOAL #4   Title  Increase ROM so patient is able to reach behind back to L3.    Time  8    Period  Weeks    Status  On-going      PT LONG TERM GOAL #5   Title  Increase shoulder strength to a solid 4+/5 to increase stability for performance of functional activities.    Time  8    Period  Weeks    Status  On-going      PT LONG TERM GOAL #6   Title  Perform ADL's with pain not > 2-3/10.    Time  8    Period  Weeks    Status  On-going            Plan - 06/22/18 1105    Clinical Impression Statement  Pt arrived today not feeling well and requested not to  perform ER due to pain.  He did well with AAROM exs today with minimal pain increase. His PROM for elevation had improved today to 120 degrees  and ER still 30 degrees.  Normal modality response today    Rehab Potential  Good    PT Treatment/Interventions  ADLs/Self Care Home Management;Moist Heat;Electrical  Stimulation;Cryotherapy;Ultrasound;Functional mobility training;Therapeutic activities;Therapeutic exercise;Passive range of motion;Manual techniques;Patient/family education    PT Next Visit Plan  Per protocol:  AAROM/ PROM Rhythmic stab    PT Home Exercise  Plan  wall slides, table slides, shoulder 4 way isometrics    Consulted and Agree with Plan of Care  Patient       Patient will benefit from skilled therapeutic intervention in order to improve the following deficits and impairments:  Pain, Postural dysfunction, Impaired UE functional use, Decreased strength  Visit Diagnosis: Acute pain of right shoulder  Stiffness of right shoulder, not elsewhere classified  Muscle weakness (generalized)  Abnormal posture     Problem List Patient Active Problem List   Diagnosis Date Noted  . Cervical spondylosis with radiculopathy 06/15/2016    Varonica Siharath,CHRIS, PTA 06/22/2018, 12:16 PM  [facility] [address] [place], [place], [address] Phone: [phone]   Fax:  [phone]  Name: KAIRO LAUBACHER MRN: [id number] Date of Birth: 1960-11-19

## 2018-06-27 ENCOUNTER — Ambulatory Visit: Payer: BLUE CROSS/BLUE SHIELD | Admitting: Physical Therapy

## 2018-06-27 ENCOUNTER — Encounter: Payer: Self-pay | Admitting: Physical Therapy

## 2018-06-27 DIAGNOSIS — M25611 Stiffness of right shoulder, not elsewhere classified: Secondary | ICD-10-CM

## 2018-06-27 DIAGNOSIS — M25511 Pain in right shoulder: Secondary | ICD-10-CM | POA: Diagnosis not present

## 2018-06-27 DIAGNOSIS — M6281 Muscle weakness (generalized): Secondary | ICD-10-CM

## 2018-06-27 NOTE — Therapy (Signed)
[facility] [address] [place], [place], [address] Phone: [phone]   Fax:  [phone]  Physical Therapy Treatment  Patient Details  Name: Jon Wong MRN: [id number] Date of Birth: February 26, 1961 Referring Provider (PT): Ramond Marrow MD.   Encounter Date: 06/27/2018  PT End of Session - 06/27/18 1051    Visit Number  9    Number of Visits  16    Date for PT Re-Evaluation  07/03/18    Authorization Type  FOTO AT LEAST EVERY 5TH VISIT, 10TH VISIT PROGRESS NOTE AND KX MODIFIER AFTER THE 15 VISIT.    PT Start Time  1030    PT Stop Time  1128    PT Time Calculation (min)  58 min    Activity Tolerance  Patient tolerated treatment well    Behavior During Therapy  WFL for tasks assessed/performed       Past Medical History:  Diagnosis Date  . Anxiety   . Articular cartilage disorder of shoulder, right 03/2018  . Dupuytren's contracture of right hand   . GERD (gastroesophageal reflux disease)   . History of hiatal hernia   . History of kidney stones   . History of skin cancer    ears  . Hypertension    states under control with med., has been on med. x 2 yr.  . Impingement syndrome of right shoulder 03/2018  . Internal hemorrhoid   . Limited joint range of motion (ROM)    cervical spine - vertical motion  . Neuropathy    bilateral feet  . Non-insulin dependent type 2 diabetes mellitus (HCC)   . Osteoarthritis of right shoulder 03/2018  . Rotator cuff strain 03/2018   right  . Ulnar nerve compression, right   . Wears partial dentures    upper    Past Surgical History:  Procedure Laterality Date  . CARPAL TUNNEL RELEASE Right 2010  . CERVICAL DISC SURGERY  1999  . CERVICAL FUSION  11/06/2002   C4-5  . COLONOSCOPY    . NECK HARDWARE REMOVAL  11/06/2002   C5-6 plate  . NECK HARDWARE REMOVAL  01/29/2008   C4-5  . POSTERIOR CERVICAL FUSION/FORAMINOTOMY N/A 06/15/2016   Procedure: CERVICAL THREE- FOUR CERVICAL FOUR-  FIVE CERVICAL FIVE-SIX CERVICAL SIX- SEVEN POSTERIOR CERVICAL FUSION WITH LATERAL MASS FIXATION;  Surgeon: Hilda Lias, MD;  Location: [facility];  Service: Neurosurgery;  Laterality: N/A;  C3-4 C4-5 C5-6 C6-7 POSTERIOR CERVICAL FUSION WITH LATERAL MASS FIXATION  . SHOULDER ARTHROSCOPY Left 1998  . SHOULDER ARTHROSCOPY WITH SUBACROMIAL DECOMPRESSION, ROTATOR CUFF REPAIR AND BICEP TENDON REPAIR Right 04/26/2018   Procedure: RIGHT SHOULDER ARTHROSCOPY WITH DEBRIDEMENT, ACROMIOPLASTY, DISTAL CLAVICLE EXCISION, ROTATOR CUFF REPAIR AND BICEP TENODESIS;  Surgeon: Bjorn Pippin, MD;  Location: [facility];  Service: Orthopedics;  Laterality: Right;  . WRIST SURGERY Right 2010   hamate fx.    There were no vitals filed for this visit.  Subjective Assessment - 06/27/18 1051    Subjective  Patitent reported feeling a little sore but tolerable.    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    Patient Stated Goals  Use right UE without pain.    Currently in Pain?  Yes    Pain Score  2     Pain Location  Shoulder    Pain Orientation  Right    Pain Descriptors / Indicators  Sore    Pain Type  Surgical pain  Pain Onset  More than a month ago    Pain Frequency  Constant         [facility] PT Assessment - 06/27/18 0001      Assessment   Medical Diagnosis  Right shoulder DCE, SAD, RCR and biceps tenodesis.    Referring Provider (PT)  Ramond Marrowax Varkey MD.    Onset Date/Surgical Date  04/27/18    Hand Dominance  Right    Next MD Visit  07/24/18                   [facility] - 06/27/18 0001      Exercises   Exercises  Shoulder      Shoulder Exercises: Supine   Other Supine Exercises  supine cane for chest press 3x 10, 3x10 flexion      Shoulder Exercises: Seated   Retraction  AROM;Both;20 reps      Shoulder Exercises: Pulleys   Flexion  Other (comment);5 minutes;1 minute   6 minutes   Other Pulley Exercises  Sitting UE  ranger x 6 mins total, flexion, cw, ccw      Modalities   Modalities  Electrical Stimulation;Vasopneumatic      Electrical Stimulation   Electrical Stimulation Location  Right shoulder. IFC x 15 mins 80-150hz     Electrical Stimulation Goals  Pain      Vasopneumatic   Number Minutes Vasopneumatic   15 minutes    Vasopnuematic Location   Shoulder    Vasopneumatic Pressure  Low    Vasopneumatic Temperature   34      Manual Therapy   Manual Therapy  Passive ROM    Passive ROM  In supine:  PROM to right shoulder into flexion, IR, and gentle ER to improve ROM; intermittent oscillations to prevent muscle relaxation.               PT Short Term Goals - 01/25/18 1257      PT SHORT TERM GOAL #1   Title  STG's=LTG's.        PT Long Term Goals - 05/30/18 1102      PT LONG TERM GOAL #1   Title  Patient will be independent with HEP.    Time  8    Period  Weeks    Status  On-going      PT LONG TERM GOAL #2   Title  Active right shoulder flexion to 145 degrees so the patient can easily reach overhead.    Time  8    Period  Weeks    Status  On-going   PROM only (88 degrees) 05/30/18     PT LONG TERM GOAL #3   Title  Active ER to 70 degrees+ to allow for easily donning/doffing of apparel.    Time  8    Period  Weeks    Status  On-going   PROM only (33 degrees)     PT LONG TERM GOAL #4   Title  Increase ROM so patient is able to reach behind back to L3.    Time  8    Period  Weeks    Status  On-going      PT LONG TERM GOAL #5   Title  Increase shoulder strength to a solid 4+/5 to increase stability for performance of functional activities.    Time  8    Period  Weeks    Status  On-going      PT LONG TERM GOAL #  6   Title  Perform ADL's with pain not > 2-3/10.    Time  8    Period  Weeks    Status  On-going            Plan - 06/27/18 1052    Clinical Impression Statement  Patient was able to tolerate treatment well depite reports of muscle fatigue with  cane exercises. Patient continues to have pain with ER therefore gentle PROM performed for a short time with greater emphasis on flexion and IR. Patient stated he is fearful of "overdoing it" patient educated importance of finding balance between doing too much and doing too little and the consequences of each. Patient reported understanding. Normal response to modalities upon removal.     Clinical Presentation  Stable    Rehab Potential  Good    Clinical Impairments Affecting Rehab Potential  FOTO initial limitation 58% current 5th visit 37%    PT Treatment/Interventions  ADLs/Self Care Home Management;Moist Heat;Electrical Stimulation;Cryotherapy;Ultrasound;Functional mobility training;Therapeutic activities;Therapeutic exercise;Passive range of motion;Manual techniques;Patient/family education    PT Next Visit Plan  Progress note and FOTO next visit; Per protocol:  AAROM/ PROM Rhythmic stab    Consulted and Agree with Plan of Care  Patient       Patient will benefit from skilled therapeutic intervention in order to improve the following deficits and impairments:  Pain, Postural dysfunction, Impaired UE functional use, Decreased strength  Visit Diagnosis: Acute pain of right shoulder  Stiffness of right shoulder, not elsewhere classified  Muscle weakness (generalized)     Problem List Patient Active Problem List   Diagnosis Date Noted  . Cervical spondylosis with radiculopathy 06/15/2016    Guss Bunde, PT, DPT 06/27/2018, 12:45 PM  [facility] [address] [place], [place], [address] Phone: [phone]   Fax:  [phone]  Name: Jon Wong MRN: [id number] Date of Birth: 20-Sep-1960

## 2018-06-29 ENCOUNTER — Encounter: Payer: Self-pay | Admitting: Physical Therapy

## 2018-06-29 ENCOUNTER — Ambulatory Visit: Payer: BLUE CROSS/BLUE SHIELD | Admitting: Physical Therapy

## 2018-06-29 DIAGNOSIS — M25611 Stiffness of right shoulder, not elsewhere classified: Secondary | ICD-10-CM

## 2018-06-29 DIAGNOSIS — M25511 Pain in right shoulder: Secondary | ICD-10-CM

## 2018-06-29 DIAGNOSIS — M6281 Muscle weakness (generalized): Secondary | ICD-10-CM

## 2018-06-29 NOTE — Therapy (Signed)
[facility] [address] [place], [place], [address] Phone: [phone]   Fax:  [phone]  Physical Therapy Treatment  Patient Details  Name: Jon Wong MRN: [id number] Date of Birth: 03/01/1961 Referring Provider (PT): Ramond Marrowax Varkey MD.   Encounter Date: 06/29/2018  PT End of Session - 06/29/18 1043    Visit Number  10    Number of Visits  16    Date for PT Re-Evaluation  07/03/18    Authorization Type  FOTO AT LEAST EVERY 5TH VISIT, 10TH VISIT PROGRESS NOTE AND KX MODIFIER AFTER THE 15 VISIT.    PT Start Time  1031    PT Stop Time  1127    PT Time Calculation (min)  56 min    Activity Tolerance  Patient tolerated treatment well    Behavior During Therapy  WFL for tasks assessed/performed       Past Medical History:  Diagnosis Date  . Anxiety   . Articular cartilage disorder of shoulder, right 03/2018  . Dupuytren's contracture of right hand   . GERD (gastroesophageal reflux disease)   . History of hiatal hernia   . History of kidney stones   . History of skin cancer    ears  . Hypertension    states under control with med., has been on med. x 2 yr.  . Impingement syndrome of right shoulder 03/2018  . Internal hemorrhoid   . Limited joint range of motion (ROM)    cervical spine - vertical motion  . Neuropathy    bilateral feet  . Non-insulin dependent type 2 diabetes mellitus (HCC)   . Osteoarthritis of right shoulder 03/2018  . Rotator cuff strain 03/2018   right  . Ulnar nerve compression, right   . Wears partial dentures    upper    Past Surgical History:  Procedure Laterality Date  . CARPAL TUNNEL RELEASE Right 2010  . CERVICAL DISC SURGERY  1999  . CERVICAL FUSION  11/06/2002   C4-5  . COLONOSCOPY    . NECK HARDWARE REMOVAL  11/06/2002   C5-6 plate  . NECK HARDWARE REMOVAL  01/29/2008   C4-5  . POSTERIOR CERVICAL FUSION/FORAMINOTOMY N/A 06/15/2016   Procedure: CERVICAL THREE- FOUR CERVICAL FOUR-  FIVE CERVICAL FIVE-SIX CERVICAL SIX- SEVEN POSTERIOR CERVICAL FUSION WITH LATERAL MASS FIXATION;  Surgeon: Hilda LiasErnesto Botero, MD;  Location: [facility];  Service: Neurosurgery;  Laterality: N/A;  C3-4 C4-5 C5-6 C6-7 POSTERIOR CERVICAL FUSION WITH LATERAL MASS FIXATION  . SHOULDER ARTHROSCOPY Left 1998  . SHOULDER ARTHROSCOPY WITH SUBACROMIAL DECOMPRESSION, ROTATOR CUFF REPAIR AND BICEP TENDON REPAIR Right 04/26/2018   Procedure: RIGHT SHOULDER ARTHROSCOPY WITH DEBRIDEMENT, ACROMIOPLASTY, DISTAL CLAVICLE EXCISION, ROTATOR CUFF REPAIR AND BICEP TENODESIS;  Surgeon: Bjorn PippinVarkey, Dax T, MD;  Location: [facility];  Service: Orthopedics;  Laterality: Right;  . WRIST SURGERY Right 2010   hamate fx.    There were no vitals filed for this visit.  Subjective Assessment - 06/29/18 1112    Subjective  About the same.    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    Patient Stated Goals  Use right UE without pain.    Currently in Pain?  Yes    Pain Score  3     Pain Location  Shoulder    Pain Orientation  Right    Pain Onset  More than a month ago         [facility] -  06/29/18 0001      PROM   PROM Assessment Site  Shoulder    Right/Left Shoulder  Right    Right Shoulder Flexion  108 Degrees                   [facility] Adult PT Treatment/Exercise - 06/29/18 0001      Shoulder Exercises: Pulleys   Flexion Limitations  5 minutes.    Other Pulley Exercises  Standing UE Ranger x 5 minutes f/b wall ladder x 4 minutes.      Modalities   Modalities  Electrical Stimulation;Moist Heat      Moist Heat Therapy   Number Minutes Moist Heat  20 Minutes    Moist Heat Location  --   Right shoulder.     Programme researcher, broadcasting/film/video Location  Right shoulder.    Electrical Stimulation Action  IFC at 80-150 Hz x 20 minutes.      Manual Therapy   Manual Therapy  Passive ROM    Passive ROM  In supine:  PROM to patient's right  shoulder x 10 minutes.               PT Short Term Goals - 01/25/18 1257      PT SHORT TERM GOAL #1   Title  STG's=LTG's.        PT Long Term Goals - 05/30/18 1102      PT LONG TERM GOAL #1   Title  Patient will be independent with HEP.    Time  8    Period  Weeks    Status  On-going      PT LONG TERM GOAL #2   Title  Active right shoulder flexion to 145 degrees so the patient can easily reach overhead.    Time  8    Period  Weeks    Status  On-going   PROM only (88 degrees) 05/30/18     PT LONG TERM GOAL #3   Title  Active ER to 70 degrees+ to allow for easily donning/doffing of apparel.    Time  8    Period  Weeks    Status  On-going   PROM only (33 degrees)     PT LONG TERM GOAL #4   Title  Increase ROM so patient is able to reach behind back to L3.    Time  8    Period  Weeks    Status  On-going      PT LONG TERM GOAL #5   Title  Increase shoulder strength to a solid 4+/5 to increase stability for performance of functional activities.    Time  8    Period  Weeks    Status  On-going      PT LONG TERM GOAL #6   Title  Perform ADL's with pain not > 2-3/10.    Time  8    Period  Weeks    Status  On-going            Plan - 06/29/18 1123    Clinical Impression Statement  See "Therapy Note" section.    Clinical Impairments Affecting Rehab Potential  FOTO initial limitation 58% current 5th visit 37%    PT Treatment/Interventions  ADLs/Self Care Home Management;Moist Heat;Electrical Stimulation;Cryotherapy;Ultrasound;Functional mobility training;Therapeutic activities;Therapeutic exercise;Passive range of motion;Manual techniques;Patient/family education    PT Next Visit Plan  Progress note and FOTO next visit; Per protocol:  AAROM/ PROM Rhythmic stab    PT  Home Exercise Plan  wall slides, table slides, shoulder 4 way isometrics    Consulted and Agree with Plan of Care  Patient       Patient will benefit from skilled therapeutic intervention in  order to improve the following deficits and impairments:  Pain, Postural dysfunction, Impaired UE functional use, Decreased strength  Visit Diagnosis: Acute pain of right shoulder  Stiffness of right shoulder, not elsewhere classified  Muscle weakness (generalized)     Problem List Patient Active Problem List   Diagnosis Date Noted  . Cervical spondylosis with radiculopathy 06/15/2016   Progress Note Reporting Period 05/08/18 to 06/29/18.  See note below for Objective Data and Assessment of Progress/Goals. The patient continues to demonstrate loss of right shoulder motion but is pleased with returning functional.  FOTO improved significantly.     Neshia Mckenzie, [place] MPT 06/29/2018, 11:27 AM  [facility] [address] [place], [place], [address] Phone: [phone]   Fax:  [phone]  Name: Jon Wong MRN: [id number] Date of Birth: Sep 23, 1960

## 2018-07-03 ENCOUNTER — Ambulatory Visit: Payer: BLUE CROSS/BLUE SHIELD | Admitting: *Deleted

## 2018-07-03 DIAGNOSIS — M25611 Stiffness of right shoulder, not elsewhere classified: Secondary | ICD-10-CM

## 2018-07-03 DIAGNOSIS — M25511 Pain in right shoulder: Secondary | ICD-10-CM

## 2018-07-03 DIAGNOSIS — M6281 Muscle weakness (generalized): Secondary | ICD-10-CM

## 2018-07-03 DIAGNOSIS — R293 Abnormal posture: Secondary | ICD-10-CM

## 2018-07-03 NOTE — Therapy (Signed)
[facility] [address] [place], [place], [address] Phone: [phone]   Fax:  [phone]  Physical Therapy Treatment  Patient Details  Name: Jon Wong MRN: [id number] Date of Birth: 1961/04/06 Referring Provider (PT): Ramond Marrow MD.   Encounter Date: 07/03/2018  PT End of Session - 07/03/18 0958    Visit Number  11    Number of Visits  16    Date for PT Re-Evaluation  07/03/18    Authorization Type  FOTO AT LEAST EVERY 5TH VISIT, 10TH VISIT PROGRESS NOTE AND KX MODIFIER AFTER THE 15 VISIT.    PT Start Time  0945    PT Stop Time  1035    PT Time Calculation (min)  50 min       Past Medical History:  Diagnosis Date  . Anxiety   . Articular cartilage disorder of shoulder, right 03/2018  . Dupuytren's contracture of right hand   . GERD (gastroesophageal reflux disease)   . History of hiatal hernia   . History of kidney stones   . History of skin cancer    ears  . Hypertension    states under control with med., has been on med. x 2 yr.  . Impingement syndrome of right shoulder 03/2018  . Internal hemorrhoid   . Limited joint range of motion (ROM)    cervical spine - vertical motion  . Neuropathy    bilateral feet  . Non-insulin dependent type 2 diabetes mellitus (HCC)   . Osteoarthritis of right shoulder 03/2018  . Rotator cuff strain 03/2018   right  . Ulnar nerve compression, right   . Wears partial dentures    upper    Past Surgical History:  Procedure Laterality Date  . CARPAL TUNNEL RELEASE Right 2010  . CERVICAL DISC SURGERY  1999  . CERVICAL FUSION  11/06/2002   C4-5  . COLONOSCOPY    . NECK HARDWARE REMOVAL  11/06/2002   C5-6 plate  . NECK HARDWARE REMOVAL  01/29/2008   C4-5  . POSTERIOR CERVICAL FUSION/FORAMINOTOMY N/A 06/15/2016   Procedure: CERVICAL THREE- FOUR CERVICAL FOUR- FIVE CERVICAL FIVE-SIX CERVICAL SIX- SEVEN POSTERIOR CERVICAL FUSION WITH LATERAL MASS FIXATION;  Surgeon: Hilda Lias, MD;  Location: [facility];  Service: Neurosurgery;  Laterality: N/A;  C3-4 C4-5 C5-6 C6-7 POSTERIOR CERVICAL FUSION WITH LATERAL MASS FIXATION  . SHOULDER ARTHROSCOPY Left 1998  . SHOULDER ARTHROSCOPY WITH SUBACROMIAL DECOMPRESSION, ROTATOR CUFF REPAIR AND BICEP TENDON REPAIR Right 04/26/2018   Procedure: RIGHT SHOULDER ARTHROSCOPY WITH DEBRIDEMENT, ACROMIOPLASTY, DISTAL CLAVICLE EXCISION, ROTATOR CUFF REPAIR AND BICEP TENODESIS;  Surgeon: Bjorn Pippin, MD;  Location: [facility];  Service: Orthopedics;  Laterality: Right;  . WRIST SURGERY Right 2010   hamate fx.    There were no vitals filed for this visit.  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    Patient Stated Goals  Use right UE without pain.    Currently in Pain?  Yes    Pain Score  5     Pain Location  Shoulder    Pain Orientation  Right    Pain Descriptors / Indicators  Sore    Pain Type  Surgical pain    Pain Onset  More than a month ago    Pain Frequency  Constant  [facility]/Exercise - 07/03/18 0001      Exercises   Exercises  Shoulder      Shoulder Exercises: Pulleys   Flexion Limitations  5 minutes.    Other Pulley Exercises  Standing UE Ranger x 5 minutes       Modalities   Modalities  Electrical Stimulation;Moist Heat      Moist Heat Therapy   Number Minutes Moist Heat  15 Minutes    Moist Heat Location  Shoulder      Electrical Stimulation   Electrical Stimulation Location  Right shoulder. IFC x 15 mins 80-150hz     Electrical Stimulation Goals  Pain      Manual Therapy   Manual Therapy  Passive ROM    Manual therapy comments  manual gentle PROM for flexion/IR/ER, then rhythmic stabs for flex/ext at 90 and IR/ER in scaption    Passive ROM  In supine:  PROM to patient's right shoulder x 10 minutes.                PT Short Term Goals - 01/25/18 1257      PT SHORT TERM GOAL #1   Title  STG's=LTG's.        PT Long Term Goals - 05/30/18 1102      PT LONG TERM GOAL #1   Title  Patient will be independent with HEP.    Time  8    Period  Weeks    Status  On-going      PT LONG TERM GOAL #2   Title  Active right shoulder flexion to 145 degrees so the patient can easily reach overhead.    Time  8    Period  Weeks    Status  On-going   PROM only (88 degrees) 05/30/18     PT LONG TERM GOAL #3   Title  Active ER to 70 degrees+ to allow for easily donning/doffing of apparel.    Time  8    Period  Weeks    Status  On-going   PROM only (33 degrees)     PT LONG TERM GOAL #4   Title  Increase ROM so patient is able to reach behind back to L3.    Time  8    Period  Weeks    Status  On-going      PT LONG TERM GOAL #5   Title  Increase shoulder strength to a solid 4+/5 to increase stability for performance of functional activities.    Time  8    Period  Weeks    Status  On-going      PT LONG TERM GOAL #6   Title  Perform ADL's with pain not > 2-3/10.    Time  8    Period  Weeks    Status  On-going            Plan - 07/03/18 1059    Clinical Impression Statement  Pt arrived today with increased pain in RT arm 5/10, but was able to perform AAROM and AROM rows and extension. He did a little better with PROM for ERtoday, but still painful and limited. Less pain with ER stretching with posterior glide to GHJ. Normal modality response     Rehab Potential  Good    Clinical Impairments Affecting Rehab Potential  FOTO initial limitation 58% current 5th visit 37%    PT Treatment/Interventions  ADLs/Self Care Home Management;Moist Heat;Electrical Stimulation;Cryotherapy;Ultrasound;Functional mobility training;Therapeutic activities;Therapeutic exercise;Passive range of  motion;Manual techniques;Patient/family education    PT Next Visit Plan   Per protocol:  AAROM/ PROM  Rhythmic stab    PT Home Exercise Plan  wall slides, table slides, shoulder 4 way isometrics    Consulted and Agree with Plan of Care  Patient       Patient will benefit from skilled therapeutic intervention in order to improve the following deficits and impairments:  Pain, Postural dysfunction, Impaired UE functional use, Decreased strength  Visit Diagnosis: Acute pain of right shoulder  Stiffness of right shoulder, not elsewhere classified  Muscle weakness (generalized)  Abnormal posture     Problem List Patient Active Problem List   Diagnosis Date Noted  . Cervical spondylosis with radiculopathy 06/15/2016    Toluwanimi Radebaugh,CHRIS, PTA 07/03/2018, 11:06 AM  [facility] [address] [place], [place], [address] Phone: [phone]   Fax:  [phone]  Name: Jon Wong MRN: [id number] Date of Birth: 1961-06-26

## 2018-07-06 ENCOUNTER — Ambulatory Visit: Payer: BLUE CROSS/BLUE SHIELD | Admitting: *Deleted

## 2018-07-06 DIAGNOSIS — M25511 Pain in right shoulder: Secondary | ICD-10-CM | POA: Diagnosis not present

## 2018-07-06 DIAGNOSIS — M25611 Stiffness of right shoulder, not elsewhere classified: Secondary | ICD-10-CM

## 2018-07-06 DIAGNOSIS — M6281 Muscle weakness (generalized): Secondary | ICD-10-CM

## 2018-07-06 DIAGNOSIS — R293 Abnormal posture: Secondary | ICD-10-CM

## 2018-07-06 NOTE — Therapy (Signed)
[facility] [address] [place], [place], [address] Phone: [phone]   Fax:  [phone]  Physical Therapy Treatment  Patient Details  Name: Jon MedinaRandy O Wong MRN: [id number] Date of Birth: 03/09/1961 Referring Provider (PT): Ramond Marrowax Varkey MD.   Encounter Date: 07/06/2018  PT End of Session - 07/06/18 1114    Visit Number  12    Number of Visits  16    Date for PT Re-Evaluation  07/03/18    Authorization Type  FOTO AT LEAST EVERY 5TH VISIT, 10TH VISIT PROGRESS NOTE AND KX MODIFIER AFTER THE 15 VISIT.    PT Start Time  1030    PT Stop Time  1128    PT Time Calculation (min)  58 min       Past Medical History:  Diagnosis Date  . Anxiety   . Articular cartilage disorder of shoulder, right 03/2018  . Dupuytren's contracture of right hand   . GERD (gastroesophageal reflux disease)   . History of hiatal hernia   . History of kidney stones   . History of skin cancer    ears  . Hypertension    states under control with med., has been on med. x 2 yr.  . Impingement syndrome of right shoulder 03/2018  . Internal hemorrhoid   . Limited joint range of motion (ROM)    cervical spine - vertical motion  . Neuropathy    bilateral feet  . Non-insulin dependent type 2 diabetes mellitus (HCC)   . Osteoarthritis of right shoulder 03/2018  . Rotator cuff strain 03/2018   right  . Ulnar nerve compression, right   . Wears partial dentures    upper    Past Surgical History:  Procedure Laterality Date  . CARPAL TUNNEL RELEASE Right 2010  . CERVICAL DISC SURGERY  1999  . CERVICAL FUSION  11/06/2002   C4-5  . COLONOSCOPY    . NECK HARDWARE REMOVAL  11/06/2002   C5-6 plate  . NECK HARDWARE REMOVAL  01/29/2008   C4-5  . POSTERIOR CERVICAL FUSION/FORAMINOTOMY N/A 06/15/2016   Procedure: CERVICAL THREE- FOUR CERVICAL FOUR- FIVE CERVICAL FIVE-SIX CERVICAL SIX- SEVEN POSTERIOR CERVICAL FUSION WITH LATERAL MASS FIXATION;  Surgeon: Hilda LiasErnesto  Botero, MD;  Location: [facility];  Service: Neurosurgery;  Laterality: N/A;  C3-4 C4-5 C5-6 C6-7 POSTERIOR CERVICAL FUSION WITH LATERAL MASS FIXATION  . SHOULDER ARTHROSCOPY Left 1998  . SHOULDER ARTHROSCOPY WITH SUBACROMIAL DECOMPRESSION, ROTATOR CUFF REPAIR AND BICEP TENDON REPAIR Right 04/26/2018   Procedure: RIGHT SHOULDER ARTHROSCOPY WITH DEBRIDEMENT, ACROMIOPLASTY, DISTAL CLAVICLE EXCISION, ROTATOR CUFF REPAIR AND BICEP TENODESIS;  Surgeon: Bjorn PippinVarkey, Dax T, MD;  Location: [facility];  Service: Orthopedics;  Laterality: Right;  . WRIST SURGERY Right 2010   hamate fx.    There were no vitals filed for this visit.  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    Patient Stated Goals  Use right UE without pain.    Currently in Pain?  Yes    Pain Score  4     Pain Location  Shoulder    Pain Orientation  Right    Pain Descriptors / Indicators  Sore    Pain Type  Surgical pain    Pain Onset  More than a month ago    Pain Frequency  Constant  [facility] Adult PT Treatment/Exercise - 07/06/18 0001      Exercises   Exercises  Shoulder      Shoulder Exercises: Pulleys   Flexion Limitations  5 minutes.    Other Pulley Exercises  Standing UE Ranger x 5 minutes f/b wall ladder x 5 minutes.      Modalities   Modalities  Electrical Stimulation;Moist Heat      Moist Heat Therapy   Number Minutes Moist Heat  15 Minutes    Moist Heat Location  Shoulder      Electrical Stimulation   Electrical Stimulation Location  Right shoulder. IFC x 15 mins 80-150hz     Electrical Stimulation Goals  Pain      Manual Therapy   Manual Therapy  Passive ROM    Manual therapy comments  manual gentle PROM for flexion/IR/ER, then rhythmic stabs for flex/ext at 90 and IR/ER in scaption               PT Short Term Goals - 01/25/18 1257      PT  SHORT TERM GOAL #1   Title  STG's=LTG's.        PT Long Term Goals - 05/30/18 1102      PT LONG TERM GOAL #1   Title  Patient will be independent with HEP.    Time  8    Period  Weeks    Status  On-going      PT LONG TERM GOAL #2   Title  Active right shoulder flexion to 145 degrees so the patient can easily reach overhead.    Time  8    Period  Weeks    Status  On-going   PROM only (88 degrees) 05/30/18     PT LONG TERM GOAL #3   Title  Active ER to 70 degrees+ to allow for easily donning/doffing of apparel.    Time  8    Period  Weeks    Status  On-going   PROM only (33 degrees)     PT LONG TERM GOAL #4   Title  Increase ROM so patient is able to reach behind back to L3.    Time  8    Period  Weeks    Status  On-going      PT LONG TERM GOAL #5   Title  Increase shoulder strength to a solid 4+/5 to increase stability for performance of functional activities.    Time  8    Period  Weeks    Status  On-going      PT LONG TERM GOAL #6   Title  Perform ADL's with pain not > 2-3/10.    Time  8    Period  Weeks    Status  On-going            Plan - 07/06/18 1222    Clinical Impression Statement  Pt arrived today doing fairly well with RT shldr. He was able to perform AAROM therex without complaints, but still very sore/painful with PROM. His PROM today was flexion  to 135 degrees and ER to 45 degrees with painandd tightness at end-ranges. posterior glide while performing ER stretch helped wirh decreased pain..    Clinical Presentation  Stable    Rehab Potential  Good    Clinical Impairments Affecting Rehab Potential  FOTO initial limitation 58% current 5th visit 37%    PT Treatment/Interventions  ADLs/Self Care Home Management;Moist Heat;Electrical Stimulation;Cryotherapy;Ultrasound;Functional mobility training;Therapeutic activities;Therapeutic exercise;Passive range of motion;Manual  techniques;Patient/family education    PT Next Visit Plan   Per protocol:   AAROM/ PROM Rhythmic stab    PT Home Exercise Plan  wall slides, table slides, shoulder 4 way isometrics    Consulted and Agree with Plan of Care  Patient       Patient will benefit from skilled therapeutic intervention in order to improve the following deficits and impairments:  Pain, Postural dysfunction, Impaired UE functional use, Decreased strength  Visit Diagnosis: Acute pain of right shoulder  Stiffness of right shoulder, not elsewhere classified  Muscle weakness (generalized)  Abnormal posture     Problem List Patient Active Problem List   Diagnosis Date Noted  . Cervical spondylosis with radiculopathy 06/15/2016    Zamari Bonsall,CHRIS, PTA 07/06/2018, 12:29 PM  [facility] [address] [place], [place], [address] Phone: [phone]   Fax:  [phone]  Name: Jon MedinaRandy O Schewe MRN: [id number] Date of Birth: 11/09/1960

## 2018-07-13 ENCOUNTER — Ambulatory Visit: Payer: BLUE CROSS/BLUE SHIELD | Admitting: *Deleted

## 2018-07-13 DIAGNOSIS — R293 Abnormal posture: Secondary | ICD-10-CM

## 2018-07-13 DIAGNOSIS — M6281 Muscle weakness (generalized): Secondary | ICD-10-CM

## 2018-07-13 DIAGNOSIS — M25611 Stiffness of right shoulder, not elsewhere classified: Secondary | ICD-10-CM

## 2018-07-13 DIAGNOSIS — M25511 Pain in right shoulder: Secondary | ICD-10-CM | POA: Diagnosis not present

## 2018-07-13 NOTE — Therapy (Signed)
[facility] [address] [place], [place], [address] Phone: [phone]   Fax:  [phone]  Physical Therapy Treatment  Patient Details  Name: Jon Wong MRN: [id number] Date of Birth: 1960-09-09 Referring Provider (PT): Ramond Marrow MD.   Encounter Date: 07/13/2018  PT End of Session - 07/13/18 1038    Visit Number  13    Number of Visits  16    Date for PT Re-Evaluation  07/03/18    Authorization Type  FOTO AT LEAST EVERY 5TH VISIT, 10TH VISIT PROGRESS NOTE AND KX MODIFIER AFTER THE 15 VISIT.   MD 07-24-18    PT Start Time  1030    PT Stop Time  1120    PT Time Calculation (min)  50 min       Past Medical History:  Diagnosis Date  . Anxiety   . Articular cartilage disorder of shoulder, right 03/2018  . Dupuytren's contracture of right hand   . GERD (gastroesophageal reflux disease)   . History of hiatal hernia   . History of kidney stones   . History of skin cancer    ears  . Hypertension    states under control with med., has been on med. x 2 yr.  . Impingement syndrome of right shoulder 03/2018  . Internal hemorrhoid   . Limited joint range of motion (ROM)    cervical spine - vertical motion  . Neuropathy    bilateral feet  . Non-insulin dependent type 2 diabetes mellitus (HCC)   . Osteoarthritis of right shoulder 03/2018  . Rotator cuff strain 03/2018   right  . Ulnar nerve compression, right   . Wears partial dentures    upper    Past Surgical History:  Procedure Laterality Date  . CARPAL TUNNEL RELEASE Right 2010  . CERVICAL DISC SURGERY  1999  . CERVICAL FUSION  11/06/2002   C4-5  . COLONOSCOPY    . NECK HARDWARE REMOVAL  11/06/2002   C5-6 plate  . NECK HARDWARE REMOVAL  01/29/2008   C4-5  . POSTERIOR CERVICAL FUSION/FORAMINOTOMY N/A 06/15/2016   Procedure: CERVICAL THREE- FOUR CERVICAL FOUR- FIVE CERVICAL FIVE-SIX CERVICAL SIX- SEVEN POSTERIOR CERVICAL FUSION WITH LATERAL MASS FIXATION;  Surgeon:  Hilda Lias, MD;  Location: [facility];  Service: Neurosurgery;  Laterality: N/A;  C3-4 C4-5 C5-6 C6-7 POSTERIOR CERVICAL FUSION WITH LATERAL MASS FIXATION  . SHOULDER ARTHROSCOPY Left 1998  . SHOULDER ARTHROSCOPY WITH SUBACROMIAL DECOMPRESSION, ROTATOR CUFF REPAIR AND BICEP TENDON REPAIR Right 04/26/2018   Procedure: RIGHT SHOULDER ARTHROSCOPY WITH DEBRIDEMENT, ACROMIOPLASTY, DISTAL CLAVICLE EXCISION, ROTATOR CUFF REPAIR AND BICEP TENODESIS;  Surgeon: Bjorn Pippin, MD;  Location: [facility];  Service: Orthopedics;  Laterality: Right;  . WRIST SURGERY Right 2010   hamate fx.    There were no vitals filed for this visit.  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    Patient Stated Goals  Use right UE without pain.    Currently in Pain?  Yes    Pain Score  4     Pain Location  Shoulder    Pain Orientation  Right    Pain Descriptors / Indicators  Sore    Pain Type  Surgical pain    Pain Onset  More than a month ago    Pain Frequency  Constant  [facility] Adult PT Treatment/Exercise - 07/13/18 0001      Exercises   Exercises  Shoulder      Shoulder Exercises: Supine   Protraction  AROM;10 reps    Flexion  AROM;10 reps    Other Supine Exercises  --      Shoulder Exercises: Sidelying   External Rotation  AROM;10 reps    Flexion  AROM      Shoulder Exercises: Pulleys   Flexion Limitations  6 mins    Other Pulley Exercises  Standing UE Ranger x 6 minutes f/b wall ladder x 5 minutes.      Modalities   Modalities  Electrical Stimulation;Moist Heat      Moist Heat Therapy   Number Minutes Moist Heat  15 Minutes    Moist Heat Location  Shoulder      Electrical Stimulation   Electrical Stimulation Location  Right shoulder. IFC x 15 mins 80-150hz     Electrical Stimulation Goals  Pain      Manual Therapy   Manual  Therapy  Passive ROM    Manual therapy comments  manual PAROM /PROM for flexion/IR/ER, then rhythmic stabs for flex/ext at 90 and IR/ER in scaption               PT Short Term Goals - 01/25/18 1257      PT SHORT TERM GOAL #1   Title  STG's=LTG's.        PT Long Term Goals - 05/30/18 1102      PT LONG TERM GOAL #1   Title  Patient will be independent with HEP.    Time  8    Period  Weeks    Status  On-going      PT LONG TERM GOAL #2   Title  Active right shoulder flexion to 145 degrees so the patient can easily reach overhead.    Time  8    Period  Weeks    Status  On-going   PROM only (88 degrees) 05/30/18     PT LONG TERM GOAL #3   Title  Active ER to 70 degrees+ to allow for easily donning/doffing of apparel.    Time  8    Period  Weeks    Status  On-going   PROM only (33 degrees)     PT LONG TERM GOAL #4   Title  Increase ROM so patient is able to reach behind back to L3.    Time  8    Period  Weeks    Status  On-going      PT LONG TERM GOAL #5   Title  Increase shoulder strength to a solid 4+/5 to increase stability for performance of functional activities.    Time  8    Period  Weeks    Status  On-going      PT LONG TERM GOAL #6   Title  Perform ADL's with pain not > 2-3/10.    Time  8    Period  Weeks    Status  On-going            Plan - 07/13/18 1218    Clinical Impression Statement  Pt arrived today with RT shldr soreness, but able to perform therex. AROM exs in supine and sidelying were added today for progression and tolerated well. His PROM was about the same flexion at 135 degrees and ER to 48 degrees.    Clinical Presentation  Stable    Rehab Potential  Good    Clinical Impairments Affecting Rehab Potential  FOTO initial limitation 58% current 5th visit 37%    PT Treatment/Interventions  ADLs/Self Care Home Management;Moist Heat;Electrical Stimulation;Cryotherapy;Ultrasound;Functional mobility training;Therapeutic  activities;Therapeutic exercise;Passive range of motion;Manual techniques;Patient/family education    PT Next Visit Plan   Per protocol:  AAROM/ PROM Rhythmic stab    PT Home Exercise Plan  wall slides, table slides, shoulder 4 way isometrics    Consulted and Agree with Plan of Care  Patient       Patient will benefit from skilled therapeutic intervention in order to improve the following deficits and impairments:  Pain, Postural dysfunction, Impaired UE functional use, Decreased strength  Visit Diagnosis: Acute pain of right shoulder  Stiffness of right shoulder, not elsewhere classified  Muscle weakness (generalized)  Abnormal posture     Problem List Patient Active Problem List   Diagnosis Date Noted  . Cervical spondylosis with radiculopathy 06/15/2016    Delmos Velaquez,CHRIS , PTA 07/13/2018, 12:21 PM  [facility] [address] [place], [place], [address] Phone: [phone]   Fax:  [phone]  Name: Deborra MedinaRandy O Wong MRN: [id number] Date of Birth: 09/16/1960

## 2018-07-19 ENCOUNTER — Ambulatory Visit: Payer: BLUE CROSS/BLUE SHIELD | Attending: Orthopaedic Surgery | Admitting: Physical Therapy

## 2018-07-19 DIAGNOSIS — M25611 Stiffness of right shoulder, not elsewhere classified: Secondary | ICD-10-CM

## 2018-07-19 DIAGNOSIS — G5621 Lesion of ulnar nerve, right upper limb: Secondary | ICD-10-CM | POA: Diagnosis present

## 2018-07-19 DIAGNOSIS — M25511 Pain in right shoulder: Secondary | ICD-10-CM | POA: Diagnosis present

## 2018-07-19 DIAGNOSIS — M542 Cervicalgia: Secondary | ICD-10-CM | POA: Diagnosis present

## 2018-07-19 DIAGNOSIS — R293 Abnormal posture: Secondary | ICD-10-CM | POA: Insufficient documentation

## 2018-07-19 DIAGNOSIS — M6281 Muscle weakness (generalized): Secondary | ICD-10-CM

## 2018-07-19 DIAGNOSIS — M25521 Pain in right elbow: Secondary | ICD-10-CM | POA: Diagnosis present

## 2018-07-19 NOTE — Therapy (Signed)
[facility] [address] [place], [place], [address] Phone: [phone]   Fax:  [phone]  Physical Therapy Treatment  Patient Details  Name: Jon Wong MRN: [id number] Date of Birth: September 26, 1960 Referring Provider (PT): Ramond Marrow MD.   Encounter Date: 07/19/2018  PT End of Session - 07/19/18 0948    Visit Number  14    Number of Visits  16    Date for PT Re-Evaluation  07/03/18    Authorization Type  FOTO AT LEAST EVERY 5TH VISIT, 10TH VISIT PROGRESS NOTE AND KX MODIFIER AFTER THE 15 VISIT.   MD 07-24-18    PT Start Time  [phone]    PT Stop Time  1041    PT Time Calculation (min)  55 min    Activity Tolerance  Patient tolerated treatment well    Behavior During Therapy  [facility] for tasks assessed/performed       Past Medical History:  Diagnosis Date  . Anxiety   . Articular cartilage disorder of shoulder, right 03/2018  . Dupuytren's contracture of right hand   . GERD (gastroesophageal reflux disease)   . History of hiatal hernia   . History of kidney stones   . History of skin cancer    ears  . Hypertension    states under control with med., has been on med. x 2 yr.  . Impingement syndrome of right shoulder 03/2018  . Internal hemorrhoid   . Limited joint range of motion (ROM)    cervical spine - vertical motion  . Neuropathy    bilateral feet  . Non-insulin dependent type 2 diabetes mellitus (HCC)   . Osteoarthritis of right shoulder 03/2018  . Rotator cuff strain 03/2018   right  . Ulnar nerve compression, right   . Wears partial dentures    upper    Past Surgical History:  Procedure Laterality Date  . CARPAL TUNNEL RELEASE Right 2010  . CERVICAL DISC SURGERY  1999  . CERVICAL FUSION  11/06/2002   C4-5  . COLONOSCOPY    . NECK HARDWARE REMOVAL  11/06/2002   C5-6 plate  . NECK HARDWARE REMOVAL  01/29/2008   C4-5  . POSTERIOR CERVICAL FUSION/FORAMINOTOMY N/A 06/15/2016   Procedure: CERVICAL THREE- FOUR  CERVICAL FOUR- FIVE CERVICAL FIVE-SIX CERVICAL SIX- SEVEN POSTERIOR CERVICAL FUSION WITH LATERAL MASS FIXATION;  Surgeon: Hilda Lias, MD;  Location: [facility];  Service: Neurosurgery;  Laterality: N/A;  C3-4 C4-5 C5-6 C6-7 POSTERIOR CERVICAL FUSION WITH LATERAL MASS FIXATION  . SHOULDER ARTHROSCOPY Left 1998  . SHOULDER ARTHROSCOPY WITH SUBACROMIAL DECOMPRESSION, ROTATOR CUFF REPAIR AND BICEP TENDON REPAIR Right 04/26/2018   Procedure: RIGHT SHOULDER ARTHROSCOPY WITH DEBRIDEMENT, ACROMIOPLASTY, DISTAL CLAVICLE EXCISION, ROTATOR CUFF REPAIR AND BICEP TENODESIS;  Surgeon: Bjorn Pippin, MD;  Location: [facility];  Service: Orthopedics;  Laterality: Right;  . WRIST SURGERY Right 2010   hamate fx.    There were no vitals filed for this visit.  Subjective Assessment - 07/19/18 0948    Subjective  Patient says he may have aggravated his shoulder doing his prone Ts and Ys.    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    Patient Stated Goals  Use right UE without pain.    Currently in Pain?  Yes    Pain Score  4     Pain Location  Shoulder    Pain Orientation  Right    Pain Descriptors / Indicators  Sore                       [facility] Adult PT Treatment/Exercise - 07/19/18 0001      Shoulder Exercises: Supine   Protraction  AROM;10 reps    External Rotation  AAROM;Right;20 reps    Flexion  AROM;Right   from 90 deg to end range     Shoulder Exercises: Sidelying   External Rotation  AROM;10 reps    Flexion  AROM;Right;20 reps      Shoulder Exercises: Pulleys   Flexion Limitations  4 min had to stop from pain    Other Pulley Exercises  Standing UE Ranger x 6 minutes f/b wall ladder x 5 minutes.      Modalities   Modalities  Electrical Stimulation;Moist Heat      Moist Heat Therapy   Number Minutes Moist Heat  15 Minutes    Moist Heat Location  Shoulder      Electrical Stimulation   Electrical Stimulation Location  Right  shoulder. IFC x 15 mins 80-150hz     Electrical Stimulation Goals  Pain      Manual Therapy   Manual Therapy  Passive ROM    Passive ROM  to right shoulder into flex/ER/IR               PT Short Term Goals - 01/25/18 1257      PT SHORT TERM GOAL #1   Title  STG's=LTG's.        PT Long Term Goals - 05/30/18 1102      PT LONG TERM GOAL #1   Title  Patient will be independent with HEP.    Time  8    Period  Weeks    Status  On-going      PT LONG TERM GOAL #2   Title  Active right shoulder flexion to 145 degrees so the patient can easily reach overhead.    Time  8    Period  Weeks    Status  On-going   PROM only (88 degrees) 05/30/18     PT LONG TERM GOAL #3   Title  Active ER to 70 degrees+ to allow for easily donning/doffing of apparel.    Time  8    Period  Weeks    Status  On-going   PROM only (33 degrees)     PT LONG TERM GOAL #4   Title  Increase ROM so patient is able to reach behind back to L3.    Time  8    Period  Weeks    Status  On-going      PT LONG TERM GOAL #5   Title  Increase shoulder strength to a solid 4+/5 to increase stability for performance of functional activities.    Time  8    Period  Weeks    Status  On-going      PT LONG TERM GOAL #6   Title  Perform ADL's with pain not > 2-3/10.    Time  8    Period  Weeks    Status  On-going            Plan - 07/19/18 1030    Clinical Impression Statement  Patient tolerated treatment fairly well today despite reporting increased pain today. He also reported that he lifted a 30# bag of cat food a few days ago without thinking but did not have any pain then.  He is compliant with  HEP.    PT Treatment/Interventions  ADLs/Self Care Home Management;Moist Heat;Electrical Stimulation;Cryotherapy;Ultrasound;Functional mobility training;Therapeutic activities;Therapeutic exercise;Passive range of motion;Manual techniques;Patient/family education    PT Next Visit Plan   Per protocol:  AAROM/  PROM Rhythmic stab    Consulted and Agree with Plan of Care  Patient       Patient will benefit from skilled therapeutic intervention in order to improve the following deficits and impairments:  Pain, Postural dysfunction, Impaired UE functional use, Decreased strength  Visit Diagnosis: Acute pain of right shoulder  Stiffness of right shoulder, not elsewhere classified  Muscle weakness (generalized)     Problem List Patient Active Problem List   Diagnosis Date Noted  . Cervical spondylosis with radiculopathy 06/15/2016    Solon Palm PT 07/19/2018, 12:30 PM  [facility] [address] [place], [place], [address] Phone: [phone]   Fax:  [phone]  Name: Jon Wong MRN: [id number] Date of Birth: [birth year]

## 2018-07-25 ENCOUNTER — Ambulatory Visit: Payer: BLUE CROSS/BLUE SHIELD | Admitting: Physical Therapy

## 2018-07-25 ENCOUNTER — Encounter: Payer: Self-pay | Admitting: Physical Therapy

## 2018-07-25 DIAGNOSIS — M25611 Stiffness of right shoulder, not elsewhere classified: Secondary | ICD-10-CM

## 2018-07-25 DIAGNOSIS — M25511 Pain in right shoulder: Secondary | ICD-10-CM | POA: Diagnosis not present

## 2018-07-25 DIAGNOSIS — R293 Abnormal posture: Secondary | ICD-10-CM

## 2018-07-25 DIAGNOSIS — M6281 Muscle weakness (generalized): Secondary | ICD-10-CM

## 2018-07-25 NOTE — Therapy (Signed)
[facility] [address] [place], [place], [address] Phone: [phone]   Fax:  [phone]  Physical Therapy Treatment  Patient Details  Name: Jon Wong MRN: [id number] Date of Birth: 08/30/1960 Referring Provider (PT): Ramond Marrowax Varkey MD.   Encounter Date: 07/25/2018  PT End of Session - 07/25/18 0947    Visit Number  15    Number of Visits  16    Date for PT Re-Evaluation  07/03/18    Authorization Type  FOTO AT LEAST EVERY 5TH VISIT, 10TH VISIT PROGRESS NOTE AND KX MODIFIER AFTER THE 15 VISIT.       PT Start Time  [phone]    PT Stop Time  1041    PT Time Calculation (min)  55 min    Activity Tolerance  Patient tolerated treatment well    Behavior During Therapy  WFL for tasks assessed/performed       Past Medical History:  Diagnosis Date  . Anxiety   . Articular cartilage disorder of shoulder, right 03/2018  . Dupuytren's contracture of right hand   . GERD (gastroesophageal reflux disease)   . History of hiatal hernia   . History of kidney stones   . History of skin cancer    ears  . Hypertension    states under control with med., has been on med. x 2 yr.  . Impingement syndrome of right shoulder 03/2018  . Internal hemorrhoid   . Limited joint range of motion (ROM)    cervical spine - vertical motion  . Neuropathy    bilateral feet  . Non-insulin dependent type 2 diabetes mellitus (HCC)   . Osteoarthritis of right shoulder 03/2018  . Rotator cuff strain 03/2018   right  . Ulnar nerve compression, right   . Wears partial dentures    upper    Past Surgical History:  Procedure Laterality Date  . CARPAL TUNNEL RELEASE Right 2010  . CERVICAL DISC SURGERY  1999  . CERVICAL FUSION  11/06/2002   C4-5  . COLONOSCOPY    . NECK HARDWARE REMOVAL  11/06/2002   C5-6 plate  . NECK HARDWARE REMOVAL  01/29/2008   C4-5  . POSTERIOR CERVICAL FUSION/FORAMINOTOMY N/A 06/15/2016   Procedure: CERVICAL THREE- FOUR CERVICAL FOUR-  FIVE CERVICAL FIVE-SIX CERVICAL SIX- SEVEN POSTERIOR CERVICAL FUSION WITH LATERAL MASS FIXATION;  Surgeon: Hilda LiasErnesto Botero, MD;  Location: [facility];  Service: Neurosurgery;  Laterality: N/A;  C3-4 C4-5 C5-6 C6-7 POSTERIOR CERVICAL FUSION WITH LATERAL MASS FIXATION  . SHOULDER ARTHROSCOPY Left 1998  . SHOULDER ARTHROSCOPY WITH SUBACROMIAL DECOMPRESSION, ROTATOR CUFF REPAIR AND BICEP TENDON REPAIR Right 04/26/2018   Procedure: RIGHT SHOULDER ARTHROSCOPY WITH DEBRIDEMENT, ACROMIOPLASTY, DISTAL CLAVICLE EXCISION, ROTATOR CUFF REPAIR AND BICEP TENODESIS;  Surgeon: Bjorn PippinVarkey, Dax T, MD;  Location: [facility];  Service: Orthopedics;  Laterality: Right;  . WRIST SURGERY Right 2010   hamate fx.    There were no vitals filed for this visit.  Subjective Assessment - 07/25/18 0944    Subjective  Reports that he went to the MD yesterday and he was very pleased with his progress and wants him to begin strengthening. Reports that MD also eliminated his lifting restrictions as well.    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    Patient Stated Goals  Use right UE without pain.    Currently in Pain?  No/denies         [facility] PT Assessment - 07/25/18  0001      Assessment   Medical Diagnosis  Right shoulder DCE, SAD, RCR and biceps tenodesis.    Referring Provider (PT)  Ramond Marrow MD.    Onset Date/Surgical Date  04/27/18    Hand Dominance  Right    Next MD Visit  09/04/2018      Precautions   Precautions  None      Restrictions   Weight Bearing Restrictions  No                   [facility] Adult PT Treatment/Exercise - 07/25/18 0001      Shoulder Exercises: Supine   Protraction  AROM;Right;20 reps    Flexion  AROM;Right;20 reps      Shoulder Exercises: Standing   Protraction  Strengthening;Right;20 reps;Theraband    Theraband Level (Shoulder Protraction)  Level 1 (Yellow)    External Rotation  Strengthening;Right;20 reps;Theraband     Theraband Level (Shoulder External Rotation)  Level 1 (Yellow)    Internal Rotation  Strengthening;Right;20 reps;Theraband    Theraband Level (Shoulder Internal Rotation)  Level 1 (Yellow)    Extension  Strengthening;Right;20 reps;Theraband    Theraband Level (Shoulder Extension)  Level 1 (Yellow)    Row  Strengthening;Right;20 reps;Theraband    Theraband Level (Shoulder Row)  Level 1 (Yellow)      Shoulder Exercises: Pulleys   Flexion  5 minutes    Other Pulley Exercises  Standing UE ranger into flexion, CW and CCW circles x20 reps each    Other Pulley Exercises  Wall ladder x10 reps   to level 29     Modalities   Modalities  Electrical Stimulation      Electrical Stimulation   Electrical Stimulation Location  R shoulder    Electrical Stimulation Action  IFC    Electrical Stimulation Parameters  80-150 hz x15 min    Electrical Stimulation Goals  Pain      Vasopneumatic   Number Minutes Vasopneumatic   15 minutes    Vasopnuematic Location   Shoulder    Vasopneumatic Pressure  Low    Vasopneumatic Temperature   48      Manual Therapy   Manual Therapy  Passive ROM    Passive ROM  PROM of R shoulder into flexion, ER, IR with gentle holds at end range               PT Short Term Goals - 01/25/18 1257      PT SHORT TERM GOAL #1   Title  STG's=LTG's.        PT Long Term Goals - 05/30/18 1102      PT LONG TERM GOAL #1   Title  Patient will be independent with HEP.    Time  8    Period  Weeks    Status  On-going      PT LONG TERM GOAL #2   Title  Active right shoulder flexion to 145 degrees so the patient can easily reach overhead.    Time  8    Period  Weeks    Status  On-going   PROM only (88 degrees) 05/30/18     PT LONG TERM GOAL #3   Title  Active ER to 70 degrees+ to allow for easily donning/doffing of apparel.    Time  8    Period  Weeks    Status  On-going   PROM only (33 degrees)     PT LONG TERM GOAL #4  Title  Increase ROM so patient is  able to reach behind back to L3.    Time  8    Period  Weeks    Status  On-going      PT LONG TERM GOAL #5   Title  Increase shoulder strength to a solid 4+/5 to increase stability for performance of functional activities.    Time  8    Period  Weeks    Status  On-going      PT LONG TERM GOAL #6   Title  Perform ADL's with pain not > 2-3/10.    Time  8    Period  Weeks    Status  On-going            Plan - 07/25/18 1104    Clinical Impression Statement  Patient presented in clinic with no R shoulder pain and per reports from MD he was allowed to progress to strengthening. Patient also reports no lifting restrictions at this time per MD. Patient progressed through initial ROM exercises to light strengthening. VCs throughout to avoid trunk rotation which patient able to self correct. No complaints of pain with any exercises today. Firm end feels and smooth arc of motion noted with all directions of PROM of R shoulder. Intermittant oscillations provided to promote relaxation of the RUE. Normal modalities response noted following removal of the modalities.    Rehab Potential  Good    Clinical Impairments Affecting Rehab Potential  FOTO initial limitation 58% current 5th visit 37%    PT Treatment/Interventions  ADLs/Self Care Home Management;Moist Heat;Electrical Stimulation;Cryotherapy;Ultrasound;Functional mobility training;Therapeutic activities;Therapeutic exercise;Passive range of motion;Manual techniques;Patient/family education    PT Next Visit Plan  Per MD progress to strengthening.    PT Home Exercise Plan  wall slides, table slides, shoulder 4 way isometrics    Consulted and Agree with Plan of Care  Patient       Patient will benefit from skilled therapeutic intervention in order to improve the following deficits and impairments:  Pain, Postural dysfunction, Impaired UE functional use, Decreased strength  Visit Diagnosis: Acute pain of right shoulder  Stiffness of right  shoulder, not elsewhere classified  Muscle weakness (generalized)  Abnormal posture     Problem List Patient Active Problem List   Diagnosis Date Noted  . Cervical spondylosis with radiculopathy 06/15/2016    Marvell Fuller, PTA 07/25/2018, 11:16 AM  [facility] [address] [place], [place], [address] Phone: [phone]   Fax:  [phone]  Name: Jon Wong MRN: [id number] Date of Birth: 04-22-1961

## 2018-07-25 NOTE — Addendum Note (Signed)
Addended by: Lajuan Kovaleski, [place] W on: 07/25/2018 03:06 PM   Modules accepted: Orders

## 2018-07-31 ENCOUNTER — Ambulatory Visit: Payer: BLUE CROSS/BLUE SHIELD | Admitting: Physical Therapy

## 2018-07-31 ENCOUNTER — Encounter: Payer: Self-pay | Admitting: Physical Therapy

## 2018-07-31 DIAGNOSIS — M25511 Pain in right shoulder: Secondary | ICD-10-CM | POA: Diagnosis not present

## 2018-07-31 DIAGNOSIS — M6281 Muscle weakness (generalized): Secondary | ICD-10-CM

## 2018-07-31 DIAGNOSIS — M25611 Stiffness of right shoulder, not elsewhere classified: Secondary | ICD-10-CM

## 2018-07-31 NOTE — Therapy (Signed)
[facility] [address] [place], [place], [address] Phone: [phone]   Fax:  [phone]  Physical Therapy Treatment  Patient Details  Name: Jon Wong MRN: [id number] Date of Birth: [birth year] Referring Provider (PT): Ramond Marrow MD.   Encounter Date: 07/31/2018  PT End of Session - 07/31/18 1032    Visit Number  16    Number of Visits  24    Date for PT Re-Evaluation  08/15/18    Authorization Type  FOTO AT LEAST EVERY 5TH VISIT, 10TH VISIT PROGRESS NOTE AND KX MODIFIER AFTER THE 15 VISIT.       PT Start Time  1032    PT Stop Time  1135    PT Time Calculation (min)  63 min    Activity Tolerance  Patient tolerated treatment well    Behavior During Therapy  WFL for tasks assessed/performed       Past Medical History:  Diagnosis Date  . Anxiety   . Articular cartilage disorder of shoulder, right 03/2018  . Dupuytren's contracture of right hand   . GERD (gastroesophageal reflux disease)   . History of hiatal hernia   . History of kidney stones   . History of skin cancer    ears  . Hypertension    states under control with med., has been on med. x 2 yr.  . Impingement syndrome of right shoulder 03/2018  . Internal hemorrhoid   . Limited joint range of motion (ROM)    cervical spine - vertical motion  . Neuropathy    bilateral feet  . Non-insulin dependent type 2 diabetes mellitus (HCC)   . Osteoarthritis of right shoulder 03/2018  . Rotator cuff strain 03/2018   right  . Ulnar nerve compression, right   . Wears partial dentures    upper    Past Surgical History:  Procedure Laterality Date  . CARPAL TUNNEL RELEASE Right 2010  . CERVICAL DISC SURGERY  1999  . CERVICAL FUSION  11/06/2002   C4-5  . COLONOSCOPY    . NECK HARDWARE REMOVAL  11/06/2002   C5-6 plate  . NECK HARDWARE REMOVAL  01/29/2008   C4-5  . POSTERIOR CERVICAL FUSION/FORAMINOTOMY N/A 06/15/2016   Procedure: CERVICAL THREE- FOUR CERVICAL FOUR-  FIVE CERVICAL FIVE-SIX CERVICAL SIX- SEVEN POSTERIOR CERVICAL FUSION WITH LATERAL MASS FIXATION;  Surgeon: Hilda Lias, MD;  Location: [facility];  Service: Neurosurgery;  Laterality: N/A;  C3-4 C4-5 C5-6 C6-7 POSTERIOR CERVICAL FUSION WITH LATERAL MASS FIXATION  . SHOULDER ARTHROSCOPY Left 1998  . SHOULDER ARTHROSCOPY WITH SUBACROMIAL DECOMPRESSION, ROTATOR CUFF REPAIR AND BICEP TENDON REPAIR Right 04/26/2018   Procedure: RIGHT SHOULDER ARTHROSCOPY WITH DEBRIDEMENT, ACROMIOPLASTY, DISTAL CLAVICLE EXCISION, ROTATOR CUFF REPAIR AND BICEP TENODESIS;  Surgeon: Bjorn Pippin, MD;  Location: [facility];  Service: Orthopedics;  Laterality: Right;  . WRIST SURGERY Right 2010   hamate fx.    There were no vitals filed for this visit.  Subjective Assessment - 07/31/18 1026    Subjective  Patient reports pain in bicep and tricep today.    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    Patient Stated Goals  Use right UE without pain.    Currently in Pain?  Yes    Pain Score  3     Pain Location  Shoulder    Pain Orientation  Right    Pain Descriptors / Indicators  Sore;Discomfort    Pain Type  Surgical pain    Pain Onset  More than a month ago         [facility] PT Assessment - 07/31/18 0001      Assessment   Medical Diagnosis  Right shoulder DCE, SAD, RCR and biceps tenodesis.    Referring Provider (PT)  Ramond Marrow MD.    Onset Date/Surgical Date  04/27/18    Hand Dominance  Right    Next MD Visit  09/04/2018      Precautions   Precautions  None      Restrictions   Weight Bearing Restrictions  No                   [facility] Adult PT Treatment/Exercise - 07/31/18 0001      Shoulder Exercises: Supine   Protraction  AROM;Right;20 reps    Flexion  AROM;Right;20 reps      Shoulder Exercises: Sidelying   External Rotation  AROM;Right;20 reps    Flexion  AROM;Right;20 reps      Shoulder Exercises: Standing   Protraction   Strengthening;Right;20 reps;Theraband    Theraband Level (Shoulder Protraction)  Level 1 (Yellow)    External Rotation  Strengthening;Right;20 reps;Theraband    Theraband Level (Shoulder External Rotation)  Level 1 (Yellow)    Internal Rotation  Strengthening;Right;20 reps;Theraband    Theraband Level (Shoulder Internal Rotation)  Level 1 (Yellow)    Extension  Strengthening;Right;20 reps;Theraband    Theraband Level (Shoulder Extension)  Level 1 (Yellow)    Row  Strengthening;Right;20 reps;Theraband    Theraband Level (Shoulder Row)  Level 1 (Yellow)      Shoulder Exercises: Pulleys   Flexion  5 minutes    Other Pulley Exercises  Standing UE ranger into flexion, CW and CCW circles x20 reps each    Other Pulley Exercises  Wall slides x10 reps      Modalities   Modalities  Electrical Stimulation;Vasopneumatic      Electrical Stimulation   Electrical Stimulation Location  R shoulder    Electrical Stimulation Action  IFC    Electrical Stimulation Parameters  80-150 hz x15 min    Electrical Stimulation Goals  Pain      Vasopneumatic   Number Minutes Vasopneumatic   15 minutes    Vasopnuematic Location   Shoulder    Vasopneumatic Pressure  Low    Vasopneumatic Temperature   61      Manual Therapy   Manual Therapy  Passive ROM    Passive ROM  PROM of R shoulder into flexion, ER, IR with gentle holds at end range             PT Education - 07/31/18 1128    Education Details  --    Person(s) Educated  --    Methods  --    Comprehension  --       PT Short Term Goals - 01/25/18 1257      PT SHORT TERM GOAL #1   Title  STG's=LTG's.        PT Long Term Goals - 05/30/18 1102      PT LONG TERM GOAL #1   Title  Patient will be independent with HEP.    Time  8    Period  Weeks    Status  On-going      PT LONG TERM GOAL #2   Title  Active right shoulder flexion to 145 degrees so the patient can easily reach overhead.    Time  8  Period  Weeks    Status   On-going   PROM only (88 degrees) 05/30/18     PT LONG TERM GOAL #3   Title  Active ER to 70 degrees+ to allow for easily donning/doffing of apparel.    Time  8    Period  Weeks    Status  On-going   PROM only (33 degrees)     PT LONG TERM GOAL #4   Title  Increase ROM so patient is able to reach behind back to L3.    Time  8    Period  Weeks    Status  On-going      PT LONG TERM GOAL #5   Title  Increase shoulder strength to a solid 4+/5 to increase stability for performance of functional activities.    Time  8    Period  Weeks    Status  On-going      PT LONG TERM GOAL #6   Title  Perform ADL's with pain not > 2-3/10.    Time  8    Period  Weeks    Status  On-going            Plan - 07/31/18 1130    Clinical Impression Statement  Patient continues to tolerate treatment well as he is progressing with antigravity strengthening exercises. Fatigue reported with wall slides in standing. Minimal correction required for trunk rotation during RW4 strengthening. Patient did not report any discomfort with AROM exercises in supine or SL. Firm end feels and smooth arc of motion noted with PROM of R shoulder. Normal modalities response noted following removal of the modalities.    Rehab Potential  Good    Clinical Impairments Affecting Rehab Potential  FOTO initial limitation 58% current 5th visit 37%    PT Treatment/Interventions  ADLs/Self Care Home Management;Moist Heat;Electrical Stimulation;Cryotherapy;Ultrasound;Functional mobility training;Therapeutic activities;Therapeutic exercise;Passive range of motion;Manual techniques;Patient/family education;Vasopneumatic Device    PT Next Visit Plan  Per MD progress to strengthening.    PT Home Exercise Plan  wall slides, table slides, shoulder 4 way isometrics    Consulted and Agree with Plan of Care  Patient       Patient will benefit from skilled therapeutic intervention in order to improve the following deficits and impairments:   Pain, Postural dysfunction, Impaired UE functional use, Decreased strength  Visit Diagnosis: Acute pain of right shoulder  Stiffness of right shoulder, not elsewhere classified  Muscle weakness (generalized)     Problem List Patient Active Problem List   Diagnosis Date Noted  . Cervical spondylosis with radiculopathy 06/15/2016    Marvell FullerKelsey P Kennon, PTA 07/31/2018, 11:39 AM  [facility] [address] [place], [place], [address] Phone: [phone]   Fax:  [phone]  Name: Deborra MedinaRandy O Hunton MRN: [id number] Date of Birth: 02/07/1961

## 2018-08-03 ENCOUNTER — Ambulatory Visit: Payer: BLUE CROSS/BLUE SHIELD | Admitting: *Deleted

## 2018-08-03 DIAGNOSIS — M25511 Pain in right shoulder: Secondary | ICD-10-CM | POA: Diagnosis not present

## 2018-08-03 DIAGNOSIS — M6281 Muscle weakness (generalized): Secondary | ICD-10-CM

## 2018-08-03 DIAGNOSIS — M25611 Stiffness of right shoulder, not elsewhere classified: Secondary | ICD-10-CM

## 2018-08-03 DIAGNOSIS — R293 Abnormal posture: Secondary | ICD-10-CM

## 2018-08-03 NOTE — Therapy (Signed)
[facility] [address] [place], [place], [address] Phone: [phone]   Fax:  [phone]  Physical Therapy Treatment  Patient Details  Name: Jon Wong MRN: [id number] Date of Birth: Mar 12, 1961 Referring Provider (PT): Ramond Marrow MD.   Encounter Date: 08/03/2018  PT End of Session - 08/03/18 1046    Visit Number  17    Number of Visits  24    Date for PT Re-Evaluation  08/15/18    Authorization Type  FOTO AT LEAST EVERY 5TH VISIT, 10TH VISIT PROGRESS NOTE AND KX MODIFIER AFTER THE 15 VISIT.       PT Start Time  1030    PT Stop Time  1129    PT Time Calculation (min)  59 min    Activity Tolerance  Patient tolerated treatment well    Behavior During Therapy  WFL for tasks assessed/performed       Past Medical History:  Diagnosis Date  . Anxiety   . Articular cartilage disorder of shoulder, right 03/2018  . Dupuytren's contracture of right hand   . GERD (gastroesophageal reflux disease)   . History of hiatal hernia   . History of kidney stones   . History of skin cancer    ears  . Hypertension    states under control with med., has been on med. x 2 yr.  . Impingement syndrome of right shoulder 03/2018  . Internal hemorrhoid   . Limited joint range of motion (ROM)    cervical spine - vertical motion  . Neuropathy    bilateral feet  . Non-insulin dependent type 2 diabetes mellitus (HCC)   . Osteoarthritis of right shoulder 03/2018  . Rotator cuff strain 03/2018   right  . Ulnar nerve compression, right   . Wears partial dentures    upper    Past Surgical History:  Procedure Laterality Date  . CARPAL TUNNEL RELEASE Right 2010  . CERVICAL DISC SURGERY  1999  . CERVICAL FUSION  11/06/2002   C4-5  . COLONOSCOPY    . NECK HARDWARE REMOVAL  11/06/2002   C5-6 plate  . NECK HARDWARE REMOVAL  01/29/2008   C4-5  . POSTERIOR CERVICAL FUSION/FORAMINOTOMY N/A 06/15/2016   Procedure: CERVICAL THREE- FOUR CERVICAL FOUR-  FIVE CERVICAL FIVE-SIX CERVICAL SIX- SEVEN POSTERIOR CERVICAL FUSION WITH LATERAL MASS FIXATION;  Surgeon: Hilda Lias, MD;  Location: [facility];  Service: Neurosurgery;  Laterality: N/A;  C3-4 C4-5 C5-6 C6-7 POSTERIOR CERVICAL FUSION WITH LATERAL MASS FIXATION  . SHOULDER ARTHROSCOPY Left 1998  . SHOULDER ARTHROSCOPY WITH SUBACROMIAL DECOMPRESSION, ROTATOR CUFF REPAIR AND BICEP TENDON REPAIR Right 04/26/2018   Procedure: RIGHT SHOULDER ARTHROSCOPY WITH DEBRIDEMENT, ACROMIOPLASTY, DISTAL CLAVICLE EXCISION, ROTATOR CUFF REPAIR AND BICEP TENODESIS;  Surgeon: Bjorn Pippin, MD;  Location: [facility];  Service: Orthopedics;  Laterality: Right;  . WRIST SURGERY Right 2010   hamate fx.    There were no vitals filed for this visit.  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    Patient Stated Goals  Use right UE without pain.    Currently in Pain?  Yes    Pain Score  1     Pain Location  Shoulder    Pain Orientation  Right    Pain Descriptors / Indicators  Sore    Pain Type  Surgical  pain    Pain Onset  More than a month ago                       [facility] Adult PT Treatment/Exercise - 08/03/18 0001      Shoulder Exercises: Supine   Protraction  AROM;Right;20 reps    Flexion  AROM;Right;20 reps      Shoulder Exercises: Sidelying   External Rotation  AROM;Right;20 reps    Flexion  AROM;Right;20 reps      Shoulder Exercises: Standing   Protraction  Strengthening;Right;20 reps;Theraband    Theraband Level (Shoulder Protraction)  Level 1 (Yellow)    External Rotation  Strengthening;Right;20 reps;Theraband    Theraband Level (Shoulder External Rotation)  Level 1 (Yellow)    Internal Rotation  Strengthening;Right;20 reps;Theraband    Theraband Level (Shoulder Internal Rotation)  Level 1 (Yellow)    Extension  Strengthening;Right;20  reps;Theraband    Theraband Level (Shoulder Extension)  Level 1 (Yellow)    Row  Strengthening;Right;20 reps;Theraband    Theraband Level (Shoulder Row)  Level 1 (Yellow)      Shoulder Exercises: Pulleys   Flexion  5 minutes    Other Pulley Exercises  Standing UE ranger into flexion, CW and CCW circles x20 reps each x 5 mins      Modalities   Modalities  Electrical Stimulation;Vasopneumatic      Electrical Stimulation   Electrical Stimulation Location  R shoulder    Electrical Stimulation Action  IFC    Electrical Stimulation Parameters  80-150hz  x 15 mins    Electrical Stimulation Goals  Pain      Vasopneumatic   Number Minutes Vasopneumatic   15 minutes    Vasopnuematic Location   Shoulder    Vasopneumatic Pressure  Low    Vasopneumatic Temperature   36      Manual Therapy   Manual Therapy  Passive ROM    Passive ROM  PROM of R shoulder into flexion, ER, IR with gentle holds at end range               PT Short Term Goals - 01/25/18 1257      PT SHORT TERM GOAL #1   Title  STG's=LTG's.        PT Long Term Goals - 05/30/18 1102      PT LONG TERM GOAL #1   Title  Patient will be independent with HEP.    Time  8    Period  Weeks    Status  On-going      PT LONG TERM GOAL #2   Title  Active right shoulder flexion to 145 degrees so the patient can easily reach overhead.    Time  8    Period  Weeks    Status  On-going   PROM only (88 degrees) 05/30/18     PT LONG TERM GOAL #3   Title  Active ER to 70 degrees+ to allow for easily donning/doffing of apparel.    Time  8    Period  Weeks    Status  On-going   PROM only (33 degrees)     PT LONG TERM GOAL #4   Title  Increase ROM so patient is able to reach behind back to L3.    Time  8    Period  Weeks    Status  On-going      PT LONG TERM GOAL #5   Title  Increase shoulder strength to a  solid 4+/5 to increase stability for performance of functional activities.    Time  8    Period  Weeks    Status   On-going      PT LONG TERM GOAL #6   Title  Perform ADL's with pain not > 2-3/10.    Time  8    Period  Weeks    Status  On-going            Plan - 08/03/18 1252    Clinical Impression Statement  Pt arrived today doing fairly well with mainly just soreness in RT shldr. He was able to perform all therex for strengthening and ROM with minimal complaints except with ER stretching.. Normal modality response.    Clinical Presentation  Stable    Rehab Potential  Good    Clinical Impairments Affecting Rehab Potential  FOTO initial limitation 58% current 5th visit 37%    PT Treatment/Interventions  ADLs/Self Care Home Management;Moist Heat;Electrical Stimulation;Cryotherapy;Ultrasound;Functional mobility training;Therapeutic activities;Therapeutic exercise;Passive range of motion;Manual techniques;Patient/family education;Vasopneumatic Device    PT Next Visit Plan  Per MD progress to strengthening.    PT Home Exercise Plan  wall slides, table slides, shoulder 4 way isometrics    Consulted and Agree with Plan of Care  Patient       Patient will benefit from skilled therapeutic intervention in order to improve the following deficits and impairments:  Pain, Postural dysfunction, Impaired UE functional use, Decreased strength  Visit Diagnosis: Acute pain of right shoulder  Stiffness of right shoulder, not elsewhere classified  Muscle weakness (generalized)  Abnormal posture     Problem List Patient Active Problem List   Diagnosis Date Noted  . Cervical spondylosis with radiculopathy 06/15/2016    RAMSEUR,CHRIS, PTA 08/03/2018, 12:54 PM  [facility] [address] [place], [place], [address] Phone: [phone]   Fax:  [phone]  Name: Jon Wong MRN: [id number] Date of Birth: 02/14/1961

## 2018-08-07 ENCOUNTER — Ambulatory Visit: Payer: BLUE CROSS/BLUE SHIELD | Admitting: Physical Therapy

## 2018-08-08 ENCOUNTER — Ambulatory Visit: Payer: BLUE CROSS/BLUE SHIELD | Admitting: Physical Therapy

## 2018-08-08 ENCOUNTER — Encounter: Payer: Self-pay | Admitting: Physical Therapy

## 2018-08-08 DIAGNOSIS — M25611 Stiffness of right shoulder, not elsewhere classified: Secondary | ICD-10-CM

## 2018-08-08 DIAGNOSIS — M25511 Pain in right shoulder: Secondary | ICD-10-CM | POA: Diagnosis not present

## 2018-08-08 NOTE — Therapy (Signed)
[facility] [address] [place], [place], [address] Phone: [phone]   Fax:  [phone]  Physical Therapy Treatment  Patient Details  Name: Jon Wong MRN: [id number] Date of Birth: 23-Aug-1960 Referring Provider (PT): Ramond Marrow MD.   Encounter Date: 08/08/2018  PT End of Session - 08/08/18 0944    Visit Number  18    Number of Visits  24    Date for PT Re-Evaluation  08/15/18    Authorization Type  FOTO AT LEAST EVERY 5TH VISIT, 10TH VISIT PROGRESS NOTE AND KX MODIFIER AFTER THE 15 VISIT.       PT Start Time  [id number]    PT Stop Time  1041    PT Time Calculation (min)  55 min    Activity Tolerance  Patient tolerated treatment well    Behavior During Therapy  WFL for tasks assessed/performed       Past Medical History:  Diagnosis Date  . Anxiety   . Articular cartilage disorder of shoulder, right 03/2018  . Dupuytren's contracture of right hand   . GERD (gastroesophageal reflux disease)   . History of hiatal hernia   . History of kidney stones   . History of skin cancer    ears  . Hypertension    states under control with med., has been on med. x 2 yr.  . Impingement syndrome of right shoulder 03/2018  . Internal hemorrhoid   . Limited joint range of motion (ROM)    cervical spine - vertical motion  . Neuropathy    bilateral feet  . Non-insulin dependent type 2 diabetes mellitus (HCC)   . Osteoarthritis of right shoulder 03/2018  . Rotator cuff strain 03/2018   right  . Ulnar nerve compression, right   . Wears partial dentures    upper    Past Surgical History:  Procedure Laterality Date  . CARPAL TUNNEL RELEASE Right 2010  . CERVICAL DISC SURGERY  1999  . CERVICAL FUSION  11/06/2002   C4-5  . COLONOSCOPY    . NECK HARDWARE REMOVAL  11/06/2002   C5-6 plate  . NECK HARDWARE REMOVAL  01/29/2008   C4-5  . POSTERIOR CERVICAL FUSION/FORAMINOTOMY N/A 06/15/2016   Procedure: CERVICAL THREE- FOUR CERVICAL FOUR-  FIVE CERVICAL FIVE-SIX CERVICAL SIX- SEVEN POSTERIOR CERVICAL FUSION WITH LATERAL MASS FIXATION;  Surgeon: Hilda Lias, MD;  Location: [facility];  Service: Neurosurgery;  Laterality: N/A;  C3-4 C4-5 C5-6 C6-7 POSTERIOR CERVICAL FUSION WITH LATERAL MASS FIXATION  . SHOULDER ARTHROSCOPY Left 1998  . SHOULDER ARTHROSCOPY WITH SUBACROMIAL DECOMPRESSION, ROTATOR CUFF REPAIR AND BICEP TENDON REPAIR Right 04/26/2018   Procedure: RIGHT SHOULDER ARTHROSCOPY WITH DEBRIDEMENT, ACROMIOPLASTY, DISTAL CLAVICLE EXCISION, ROTATOR CUFF REPAIR AND BICEP TENODESIS;  Surgeon: Bjorn Pippin, MD;  Location: [facility];  Service: Orthopedics;  Laterality: Right;  . WRIST SURGERY Right 2010   hamate fx.    There were no vitals filed for this visit.  Subjective Assessment - 08/08/18 0944    Subjective  Denies any pain or soreness upon arrival.    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    Patient Stated Goals  Use right UE without pain.    Currently in Pain?  No/denies         [facility] PT Assessment - 08/08/18 0001      Assessment   Medical Diagnosis  Right shoulder DCE, SAD, RCR and biceps tenodesis.    Referring Provider (  PT)  Ramond Marrowax Varkey MD.    Onset Date/Surgical Date  04/27/18    Hand Dominance  Right    Next MD Visit  09/04/2018      Precautions   Precautions  None      Restrictions   Weight Bearing Restrictions  No      ROM / Strength   AROM / PROM / Strength  AROM      AROM   Overall AROM   Within functional limits for tasks performed    AROM Assessment Site  Shoulder    Right/Left Shoulder  Right    Right Shoulder Flexion  132 Degrees                   [facility] Adult PT Treatment/Exercise - 08/08/18 0001      Shoulder Exercises: Prone   Retraction  AROM;Right;20 reps    Extension  AROM;Right;20 reps    Horizontal ABduction 1  AROM;Right;20 reps      Shoulder Exercises: Sidelying   External Rotation  AROM;Right;20 reps       Shoulder Exercises: Standing   Protraction  Strengthening;Right;20 reps;Theraband    Theraband Level (Shoulder Protraction)  Level 2 (Red)    External Rotation  Strengthening;Right;20 reps;Theraband    Theraband Level (Shoulder External Rotation)  Level 2 (Red)    Internal Rotation  Strengthening;Right;20 reps;Theraband    Theraband Level (Shoulder Internal Rotation)  Level 2 (Red)    Flexion  AROM;Right;20 reps    ABduction  AROM;Right;20 reps    Extension  Strengthening;Right;20 reps;Theraband    Theraband Level (Shoulder Extension)  Level 2 (Red)    Row  Strengthening;Right;20 reps;Theraband    Theraband Level (Shoulder Row)  Level 2 (Red)    Diagonals  AROM;Right;20 reps    Other Standing Exercises  RUE wall slides x20 reps    Other Standing Exercises  R shoulder AROM scaption x20 reps      Shoulder Exercises: Pulleys   Flexion  5 minutes      Modalities   Modalities  Electrical Stimulation;Vasopneumatic      Electrical Stimulation   Electrical Stimulation Location  R shoulder    Electrical Stimulation Action  IFC    Electrical Stimulation Parameters  80-150 hz x15 min    Electrical Stimulation Goals  Pain      Vasopneumatic   Number Minutes Vasopneumatic   15 minutes    Vasopnuematic Location   Shoulder    Vasopneumatic Pressure  Low    Vasopneumatic Temperature   34      Manual Therapy   Manual Therapy  Passive ROM    Passive ROM  PROM of R shoulder into flexion, ER, IR with gentle holds at end range             PT Education - 08/08/18 1100    Education Details  HEP- RW4 with red theraband    Person(s) Educated  Patient    Methods  Explanation;Handout;Demonstration    Comprehension  Verbalized understanding       PT Short Term Goals - 01/25/18 1257      PT SHORT TERM GOAL #1   Title  STG's=LTG's.        PT Long Term Goals - 05/30/18 1102      PT LONG TERM GOAL #1   Title  Patient will be independent with HEP.    Time  8    Period  Weeks     Status  On-going  PT LONG TERM GOAL #2   Title  Active right shoulder flexion to 145 degrees so the patient can easily reach overhead.    Time  8    Period  Weeks    Status  On-going   PROM only (88 degrees) 05/30/18     PT LONG TERM GOAL #3   Title  Active ER to 70 degrees+ to allow for easily donning/doffing of apparel.    Time  8    Period  Weeks    Status  On-going   PROM only (33 degrees)     PT LONG TERM GOAL #4   Title  Increase ROM so patient is able to reach behind back to L3.    Time  8    Period  Weeks    Status  On-going      PT LONG TERM GOAL #5   Title  Increase shoulder strength to a solid 4+/5 to increase stability for performance of functional activities.    Time  8    Period  Weeks    Status  On-going      PT LONG TERM GOAL #6   Title  Perform ADL's with pain not > 2-3/10.    Time  8    Period  Weeks    Status  On-going            Plan - 08/08/18 1100    Clinical Impression Statement  Patient presented in clinic with no complaints of pain or soreness in R shoulder. Patient progressed through strengthening exercises with increased resistance. Patient only reported discomfort in R shoulder with SL ER, bent horizontal abduction. Patient still experiencing discomfort as well with PROM R shoulder ER. AROM of R shoulder flexion measured as 132 deg in standing. Patient provided new strengthening HEP of RW4 with red theraband. Patient shown how to attach theraband to door jam.     Rehab Potential  Good    Clinical Impairments Affecting Rehab Potential  FOTO initial limitation 58% current 5th visit 37%    PT Treatment/Interventions  ADLs/Self Care Home Management;Moist Heat;Electrical Stimulation;Cryotherapy;Ultrasound;Functional mobility training;Therapeutic activities;Therapeutic exercise;Passive range of motion;Manual techniques;Patient/family education;Vasopneumatic Device    PT Next Visit Plan  Per MD progress to strengthening.    PT Home Exercise Plan   RW4 with red theraband    Consulted and Agree with Plan of Care  Patient       Patient will benefit from skilled therapeutic intervention in order to improve the following deficits and impairments:  Pain, Postural dysfunction, Impaired UE functional use, Decreased strength  Visit Diagnosis: Acute pain of right shoulder  Stiffness of right shoulder, not elsewhere classified     Problem List Patient Active Problem List   Diagnosis Date Noted  . Cervical spondylosis with radiculopathy 06/15/2016    Marvell FullerKelsey P Kennon, PTA 08/08/2018, 11:27 AM  [facility] [address] [place], [place], [address] Phone: [phone]   Fax:  [phone]  Name: Deborra MedinaRandy O Wong MRN: [id number] Date of Birth: 12/06/1960

## 2018-08-10 ENCOUNTER — Ambulatory Visit: Payer: BLUE CROSS/BLUE SHIELD | Admitting: *Deleted

## 2018-08-10 DIAGNOSIS — M25611 Stiffness of right shoulder, not elsewhere classified: Secondary | ICD-10-CM

## 2018-08-10 DIAGNOSIS — M25511 Pain in right shoulder: Secondary | ICD-10-CM

## 2018-08-10 DIAGNOSIS — M6281 Muscle weakness (generalized): Secondary | ICD-10-CM

## 2018-08-10 NOTE — Therapy (Signed)
[facility] [address] [place], [place], [address] Phone: [phone]   Fax:  [phone]  Physical Therapy Treatment  Patient Details  Name: Jon MedinaRandy O Wong MRN: [id number] Date of Birth: 12/19/1960 Referring Provider (PT): Ramond Marrowax Varkey MD.   Encounter Date: 08/10/2018  PT End of Session - 08/10/18 1255    Visit Number  19    Number of Visits  24    Date for PT Re-Evaluation  08/15/18    Authorization Type  FOTO AT LEAST EVERY 5TH VISIT, 10TH VISIT PROGRESS NOTE AND KX MODIFIER AFTER THE 15 VISIT.       PT Start Time  1030    PT Stop Time  1130    PT Time Calculation (min)  60 min       Past Medical History:  Diagnosis Date  . Anxiety   . Articular cartilage disorder of shoulder, right 03/2018  . Dupuytren's contracture of right hand   . GERD (gastroesophageal reflux disease)   . History of hiatal hernia   . History of kidney stones   . History of skin cancer    ears  . Hypertension    states under control with med., has been on med. x 2 yr.  . Impingement syndrome of right shoulder 03/2018  . Internal hemorrhoid   . Limited joint range of motion (ROM)    cervical spine - vertical motion  . Neuropathy    bilateral feet  . Non-insulin dependent type 2 diabetes mellitus (HCC)   . Osteoarthritis of right shoulder 03/2018  . Rotator cuff strain 03/2018   right  . Ulnar nerve compression, right   . Wears partial dentures    upper    Past Surgical History:  Procedure Laterality Date  . CARPAL TUNNEL RELEASE Right 2010  . CERVICAL DISC SURGERY  1999  . CERVICAL FUSION  11/06/2002   C4-5  . COLONOSCOPY    . NECK HARDWARE REMOVAL  11/06/2002   C5-6 plate  . NECK HARDWARE REMOVAL  01/29/2008   C4-5  . POSTERIOR CERVICAL FUSION/FORAMINOTOMY N/A 06/15/2016   Procedure: CERVICAL THREE- FOUR CERVICAL FOUR- FIVE CERVICAL FIVE-SIX CERVICAL SIX- SEVEN POSTERIOR CERVICAL FUSION WITH LATERAL MASS FIXATION;  Surgeon: Hilda LiasErnesto  Botero, MD;  Location: [facility];  Service: Neurosurgery;  Laterality: N/A;  C3-4 C4-5 C5-6 C6-7 POSTERIOR CERVICAL FUSION WITH LATERAL MASS FIXATION  . SHOULDER ARTHROSCOPY Left 1998  . SHOULDER ARTHROSCOPY WITH SUBACROMIAL DECOMPRESSION, ROTATOR CUFF REPAIR AND BICEP TENDON REPAIR Right 04/26/2018   Procedure: RIGHT SHOULDER ARTHROSCOPY WITH DEBRIDEMENT, ACROMIOPLASTY, DISTAL CLAVICLE EXCISION, ROTATOR CUFF REPAIR AND BICEP TENODESIS;  Surgeon: Bjorn PippinVarkey, Dax T, MD;  Location: [facility];  Service: Orthopedics;  Laterality: Right;  . WRIST SURGERY Right 2010   hamate fx.    There were no vitals filed for this visit.  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    Patient Stated Goals  Use right UE without pain.    Pain Score  3     Pain Location  Shoulder    Pain Orientation  Right    Pain Descriptors / Indicators  Sore    Pain Onset  More than a month ago                       [facility] Adult PT Treatment/Exercise - 08/10/18  0001      Shoulder Exercises: Sidelying   External Rotation  AROM;Right;20 reps      Shoulder Exercises: Standing   Protraction  Strengthening;Right;20 reps;Theraband    Theraband Level (Shoulder Protraction)  Level 2 (Red)    External Rotation  Strengthening;Right;20 reps;Theraband    Theraband Level (Shoulder External Rotation)  Level 2 (Red)    Internal Rotation  Strengthening;Right;20 reps;Theraband    Theraband Level (Shoulder Internal Rotation)  Level 2 (Red)    Extension  Strengthening;Right;20 reps;Theraband    Theraband Level (Shoulder Extension)  Level 2 (Red)    Row  Strengthening;Right;20 reps;Theraband    Theraband Level (Shoulder Row)  Level 2 (Red)    Other Standing Exercises  RUE wall slides flexion and circles each way 2x10      Shoulder Exercises: Pulleys   Flexion  5 minutes      Modalities    Modalities  Electrical Stimulation;Vasopneumatic      Electrical Stimulation   Electrical Stimulation Location  R shoulder  IFC x 15 mins 80-150hz     Electrical Stimulation Goals  Pain      Vasopneumatic   Number Minutes Vasopneumatic   15 minutes    Vasopnuematic Location   Shoulder    Vasopneumatic Pressure  Low    Vasopneumatic Temperature   34      Manual Therapy   Manual Therapy  Passive ROM    Passive ROM  PROM of R shoulder into flexion, ER, IR with gentle holds at end range               PT Short Term Goals - 01/25/18 1257      PT SHORT TERM GOAL #1   Title  STG's=LTG's.        PT Long Term Goals - 05/30/18 1102      PT LONG TERM GOAL #1   Title  Patient will be independent with HEP.    Time  8    Period  Weeks    Status  On-going      PT LONG TERM GOAL #2   Title  Active right shoulder flexion to 145 degrees so the patient can easily reach overhead.    Time  8    Period  Weeks    Status  On-going   PROM only (88 degrees) 05/30/18     PT LONG TERM GOAL #3   Title  Active ER to 70 degrees+ to allow for easily donning/doffing of apparel.    Time  8    Period  Weeks    Status  On-going   PROM only (33 degrees)     PT LONG TERM GOAL #4   Title  Increase ROM so patient is able to reach behind back to L3.    Time  8    Period  Weeks    Status  On-going      PT LONG TERM GOAL #5   Title  Increase shoulder strength to a solid 4+/5 to increase stability for performance of functional activities.    Time  8    Period  Weeks    Status  On-going      PT LONG TERM GOAL #6   Title  Perform ADL's with pain not > 2-3/10.    Time  8    Period  Weeks    Status  On-going              Patient will benefit from skilled therapeutic intervention  in order to improve the following deficits and impairments:     Visit Diagnosis: Acute pain of right shoulder  Stiffness of right shoulder, not elsewhere classified  Muscle weakness  (generalized)     Problem List Patient Active Problem List   Diagnosis Date Noted  . Cervical spondylosis with radiculopathy 06/15/2016    Guillermo Difrancesco,CHRIS, PTA 08/10/2018, 12:56 PM  [facility] [address] [place], [place], [address] Phone: [phone]   Fax:  [phone]  Name: Jon Wong MRN: [id number] Date of Birth: Jun 01, 1961

## 2018-08-14 ENCOUNTER — Ambulatory Visit: Payer: BLUE CROSS/BLUE SHIELD | Admitting: *Deleted

## 2018-08-14 DIAGNOSIS — M6281 Muscle weakness (generalized): Secondary | ICD-10-CM

## 2018-08-14 DIAGNOSIS — M25611 Stiffness of right shoulder, not elsewhere classified: Secondary | ICD-10-CM

## 2018-08-14 DIAGNOSIS — M25511 Pain in right shoulder: Secondary | ICD-10-CM | POA: Diagnosis not present

## 2018-08-14 DIAGNOSIS — R293 Abnormal posture: Secondary | ICD-10-CM

## 2018-08-14 NOTE — Therapy (Signed)
[facility] [address] [place], [place], [address] Phone: [phone]   Fax:  [phone]  Physical Therapy Treatment  Patient Details  Name: Jon Wong MRN: [id number] Date of Birth: 02-23-1961 Referring Provider (PT): Ramond Marrow MD.   Encounter Date: 08/14/2018  PT End of Session - 08/14/18 1032    Visit Number  20    Number of Visits  24    Date for PT Re-Evaluation  08/15/18    Authorization Type  FOTO AT LEAST EVERY 5TH VISIT, 10TH VISIT PROGRESS NOTE AND KX MODIFIER AFTER THE 15 VISIT.       PT Start Time  0945    PT Stop Time  1043    PT Time Calculation (min)  58 min       Past Medical History:  Diagnosis Date  . Anxiety   . Articular cartilage disorder of shoulder, right 03/2018  . Dupuytren's contracture of right hand   . GERD (gastroesophageal reflux disease)   . History of hiatal hernia   . History of kidney stones   . History of skin cancer    ears  . Hypertension    states under control with med., has been on med. x 2 yr.  . Impingement syndrome of right shoulder 03/2018  . Internal hemorrhoid   . Limited joint range of motion (ROM)    cervical spine - vertical motion  . Neuropathy    bilateral feet  . Non-insulin dependent type 2 diabetes mellitus (HCC)   . Osteoarthritis of right shoulder 03/2018  . Rotator cuff strain 03/2018   right  . Ulnar nerve compression, right   . Wears partial dentures    upper    Past Surgical History:  Procedure Laterality Date  . CARPAL TUNNEL RELEASE Right 2010  . CERVICAL DISC SURGERY  1999  . CERVICAL FUSION  11/06/2002   C4-5  . COLONOSCOPY    . NECK HARDWARE REMOVAL  11/06/2002   C5-6 plate  . NECK HARDWARE REMOVAL  01/29/2008   C4-5  . POSTERIOR CERVICAL FUSION/FORAMINOTOMY N/A 06/15/2016   Procedure: CERVICAL THREE- FOUR CERVICAL FOUR- FIVE CERVICAL FIVE-SIX CERVICAL SIX- SEVEN POSTERIOR CERVICAL FUSION WITH LATERAL MASS FIXATION;  Surgeon: Hilda Lias, MD;  Location: [facility];  Service: Neurosurgery;  Laterality: N/A;  C3-4 C4-5 C5-6 C6-7 POSTERIOR CERVICAL FUSION WITH LATERAL MASS FIXATION  . SHOULDER ARTHROSCOPY Left 1998  . SHOULDER ARTHROSCOPY WITH SUBACROMIAL DECOMPRESSION, ROTATOR CUFF REPAIR AND BICEP TENDON REPAIR Right 04/26/2018   Procedure: RIGHT SHOULDER ARTHROSCOPY WITH DEBRIDEMENT, ACROMIOPLASTY, DISTAL CLAVICLE EXCISION, ROTATOR CUFF REPAIR AND BICEP TENODESIS;  Surgeon: Bjorn Pippin, MD;  Location: [facility];  Service: Orthopedics;  Laterality: Right;  . WRIST SURGERY Right 2010   hamate fx.    There were no vitals filed for this visit.                    [facility] Adult PT Treatment/Exercise - 08/14/18 0001      Shoulder Exercises: Sidelying   External Rotation  AROM;Right;20 reps;10 reps    External Rotation Weight (lbs)  1#    Flexion  AROM;10 reps;20 reps      Shoulder Exercises: Standing   Protraction  Strengthening;Right;20 reps;Theraband    Theraband Level (Shoulder Protraction)  Level 2 (Red)    External Rotation  Strengthening;Right;20 reps;Theraband    Theraband Level (Shoulder External Rotation)  Level 2 (Red)    Internal Rotation  Strengthening;Right;20 reps;Theraband  Theraband Level (Shoulder Internal Rotation)  Level 2 (Red)    Extension  Strengthening;Right;20 reps;Theraband    Theraband Level (Shoulder Extension)  Level 2 (Red)    Row  Strengthening;Right;20 reps;Theraband    Theraband Level (Shoulder Row)  Level 2 (Red)    Other Standing Exercises  RUE wall slides flexion and circles each way 2x10      Shoulder Exercises: Pulleys   Flexion  5 minutes      Modalities   Modalities  Electrical Stimulation;Vasopneumatic      Electrical Stimulation   Electrical Stimulation Location  R shoulder  IFC x 15 mins 80-150hz     Electrical Stimulation Goals  Pain      Vasopneumatic   Number Minutes Vasopneumatic   15 minutes    Vasopnuematic Location   Shoulder     Vasopneumatic Pressure  Low    Vasopneumatic Temperature   34      Manual Therapy   Manual Therapy  Passive ROM    Passive ROM  PROM of R shoulder into flexion, ER, IR with gentle holds at end range               PT Short Term Goals - 01/25/18 1257      PT SHORT TERM GOAL #1   Title  STG's=LTG's.        PT Long Term Goals - 05/30/18 1102      PT LONG TERM GOAL #1   Title  Patient will be independent with HEP.    Time  8    Period  Weeks    Status  On-going      PT LONG TERM GOAL #2   Title  Active right shoulder flexion to 145 degrees so the patient can easily reach overhead.    Time  8    Period  Weeks    Status  On-going   PROM only (88 degrees) 05/30/18     PT LONG TERM GOAL #3   Title  Active ER to 70 degrees+ to allow for easily donning/doffing of apparel.    Time  8    Period  Weeks    Status  On-going   PROM only (33 degrees)     PT LONG TERM GOAL #4   Title  Increase ROM so patient is able to reach behind back to L3.    Time  8    Period  Weeks    Status  On-going      PT LONG TERM GOAL #5   Title  Increase shoulder strength to a solid 4+/5 to increase stability for performance of functional activities.    Time  8    Period  Weeks    Status  On-going      PT LONG TERM GOAL #6   Title  Perform ADL's with pain not > 2-3/10.    Time  8    Period  Weeks    Status  On-going            Plan - 08/14/18 1032    Clinical Impression Statement  Pt arrived today doing fairly well with RT shldr and reports that he can tell he is getting stronger. He was able to perform all therex today with mainly fatigue. Normal modality responses.    Clinical Presentation  Stable    Rehab Potential  Good    Clinical Impairments Affecting Rehab Potential  FOTO initial limitation 58% current 5th visit 37%    PT Treatment/Interventions  ADLs/Self Care Home Management;Moist Heat;Electrical Stimulation;Cryotherapy;Ultrasound;Functional mobility training;Therapeutic  activities;Therapeutic exercise;Passive range of motion;Manual techniques;Patient/family education;Vasopneumatic Device    PT Next Visit Plan  Per MD progress to strengthening.    PT Home Exercise Plan  RW4 with red theraband    Consulted and Agree with Plan of Care  Patient       Patient will benefit from skilled therapeutic intervention in order to improve the following deficits and impairments:  Pain, Postural dysfunction, Impaired UE functional use, Decreased strength  Visit Diagnosis: Acute pain of right shoulder  Stiffness of right shoulder, not elsewhere classified  Muscle weakness (generalized)  Abnormal posture     Problem List Patient Active Problem List   Diagnosis Date Noted  . Cervical spondylosis with radiculopathy 06/15/2016    ,CHRIS, PTA 08/14/2018, 11:28 AM  [facility] [address] [place], [place], [address] Phone: [phone]   Fax:  [phone]  Name: Jon Wong MRN: [id number] Date of Birth: 04-03-1961

## 2018-08-17 ENCOUNTER — Encounter: Payer: Self-pay | Admitting: Physical Therapy

## 2018-08-17 ENCOUNTER — Ambulatory Visit: Payer: BLUE CROSS/BLUE SHIELD | Admitting: Physical Therapy

## 2018-08-17 DIAGNOSIS — G5621 Lesion of ulnar nerve, right upper limb: Secondary | ICD-10-CM

## 2018-08-17 DIAGNOSIS — M6281 Muscle weakness (generalized): Secondary | ICD-10-CM

## 2018-08-17 DIAGNOSIS — R293 Abnormal posture: Secondary | ICD-10-CM

## 2018-08-17 DIAGNOSIS — M25511 Pain in right shoulder: Secondary | ICD-10-CM

## 2018-08-17 DIAGNOSIS — M542 Cervicalgia: Secondary | ICD-10-CM

## 2018-08-17 DIAGNOSIS — M25611 Stiffness of right shoulder, not elsewhere classified: Secondary | ICD-10-CM

## 2018-08-17 DIAGNOSIS — M25521 Pain in right elbow: Secondary | ICD-10-CM

## 2018-08-17 NOTE — Therapy (Signed)
[facility] [address] [place], [place], [address] Phone: [phone]   Fax:  [phone]  Physical Therapy Treatment  Patient Details  Name: Jon Wong MRN: [id number] Date of Birth: 1961-01-19 Referring Provider (PT): Ramond Marrow MD.   Encounter Date: 08/17/2018  PT End of Session - 08/17/18 1043    Visit Number  21    Number of Visits  24    Date for PT Re-Evaluation  08/15/18    Authorization Type  FOTO AT LEAST EVERY 5TH VISIT, 10TH VISIT PROGRESS NOTE AND KX MODIFIER AFTER THE 15 VISIT.       PT Start Time  1030    PT Stop Time  1120    PT Time Calculation (min)  50 min    Activity Tolerance  Patient tolerated treatment well    Behavior During Therapy  WFL for tasks assessed/performed       Past Medical History:  Diagnosis Date  . Anxiety   . Articular cartilage disorder of shoulder, right 03/2018  . Dupuytren's contracture of right hand   . GERD (gastroesophageal reflux disease)   . History of hiatal hernia   . History of kidney stones   . History of skin cancer    ears  . Hypertension    states under control with med., has been on med. x 2 yr.  . Impingement syndrome of right shoulder 03/2018  . Internal hemorrhoid   . Limited joint range of motion (ROM)    cervical spine - vertical motion  . Neuropathy    bilateral feet  . Non-insulin dependent type 2 diabetes mellitus (HCC)   . Osteoarthritis of right shoulder 03/2018  . Rotator cuff strain 03/2018   right  . Ulnar nerve compression, right   . Wears partial dentures    upper    Past Surgical History:  Procedure Laterality Date  . CARPAL TUNNEL RELEASE Right 2010  . CERVICAL DISC SURGERY  1999  . CERVICAL FUSION  11/06/2002   C4-5  . COLONOSCOPY    . NECK HARDWARE REMOVAL  11/06/2002   C5-6 plate  . NECK HARDWARE REMOVAL  01/29/2008   C4-5  . POSTERIOR CERVICAL FUSION/FORAMINOTOMY N/A 06/15/2016   Procedure: CERVICAL THREE- FOUR CERVICAL FOUR-  FIVE CERVICAL FIVE-SIX CERVICAL SIX- SEVEN POSTERIOR CERVICAL FUSION WITH LATERAL MASS FIXATION;  Surgeon: Hilda Lias, MD;  Location: [facility];  Service: Neurosurgery;  Laterality: N/A;  C3-4 C4-5 C5-6 C6-7 POSTERIOR CERVICAL FUSION WITH LATERAL MASS FIXATION  . SHOULDER ARTHROSCOPY Left 1998  . SHOULDER ARTHROSCOPY WITH SUBACROMIAL DECOMPRESSION, ROTATOR CUFF REPAIR AND BICEP TENDON REPAIR Right 04/26/2018   Procedure: RIGHT SHOULDER ARTHROSCOPY WITH DEBRIDEMENT, ACROMIOPLASTY, DISTAL CLAVICLE EXCISION, ROTATOR CUFF REPAIR AND BICEP TENODESIS;  Surgeon: Bjorn Pippin, MD;  Location: [facility];  Service: Orthopedics;  Laterality: Right;  . WRIST SURGERY Right 2010   hamate fx.    There were no vitals filed for this visit.  Subjective Assessment - 08/17/18 1042    Subjective  Pt reporting no pain at rest. Pt did report mild pain when trying to wash under his left armpit using his R UE.     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    Patient Stated Goals  Use right UE without pain.    Currently in Pain?  No/denies         [facility] PT Assessment - 08/17/18 0001      Assessment  Medical Diagnosis  Right shoulder DCE, SAD, RCR and biceps tenodesis.    Referring Provider (PT)  Ramond Marrow MD.    Onset Date/Surgical Date  04/27/18    Hand Dominance  Right    Next MD Visit  09/04/2018      Precautions   Precautions  None      ROM / Strength   AROM / PROM / Strength  AROM      AROM   Right Shoulder Flexion  138 Degrees    Right Shoulder Internal Rotation  --   thumb to L2   Right Shoulder External Rotation  30 Degrees                   [facility] Adult PT Treatment/Exercise - 08/17/18 0001      Shoulder Exercises: Supine   Diagonals  Strengthening;10 reps    Theraband Level (Shoulder Diagonals)  Level 2 (Red)      Shoulder Exercises: Sidelying   External Rotation  AROM;Right;20 reps;10 reps    External Rotation Weight  (lbs)  1#   using weighted ball     Shoulder Exercises: Standing   Protraction  Strengthening;Right;20 reps;Theraband    Theraband Level (Shoulder Protraction)  Level 2 (Red)    External Rotation  Strengthening;Right;20 reps;Theraband    Theraband Level (Shoulder External Rotation)  Level 2 (Red)    Internal Rotation  Strengthening;Right;20 reps;Theraband    Theraband Level (Shoulder Internal Rotation)  Level 2 (Red)    Extension  Strengthening;Right;20 reps;Theraband    Theraband Level (Shoulder Extension)  Level 2 (Red)    Row  Strengthening;Right;20 reps;Theraband    Theraband Level (Shoulder Row)  Level 2 (Red)    Other Standing Exercises  RUE wall slides flexion and circles each way 2x10    Other Standing Exercises  IR using towel for stretching x 10 reps, extension using towel and bilateral UE's x 10 reps      Shoulder Exercises: Pulleys   Flexion  5 minutes      Modalities   Modalities  Electrical Stimulation;Vasopneumatic      Electrical Stimulation   Electrical Stimulation Location  R shoulder  IFC x 15 mins 80-150hz     Electrical Stimulation Goals  Pain      Vasopneumatic   Number Minutes Vasopneumatic   15 minutes    Vasopnuematic Location   Shoulder    Vasopneumatic Pressure  Low    Vasopneumatic Temperature   34      Manual Therapy   Manual Therapy  Passive ROM    Passive ROM  PROM of R shoulder into flexion, ER, IR with gentle holds at end range             PT Education - 08/17/18 1228    Education Details  Reviewed HEP    Person(s) Educated  Patient    Methods  Explanation    Comprehension  Verbalized understanding       PT Short Term Goals - 01/25/18 1257      PT SHORT TERM GOAL #1   Title  STG's=LTG's.        PT Long Term Goals - 08/17/18 1232      PT LONG TERM GOAL #1   Title  Patient will be independent with HEP.    Time  8    Period  Weeks    Status  On-going      PT LONG TERM GOAL #2   Title  Active right shoulder flexion  to  145 degrees so the patient can easily reach overhead.    Time  8    Period  Weeks    Status  On-going      PT LONG TERM GOAL #3   Title  Active ER to 70 degrees+ to allow for easily donning/doffing of apparel.    Period  Weeks    Status  On-going      PT LONG TERM GOAL #4   Title  Increase ROM so patient is able to reach behind back to L3.    Time  8    Period  Weeks    Status  On-going      PT LONG TERM GOAL #5   Title  Increase shoulder strength to a solid 4+/5 to increase stability for performance of functional activities.    Time  8    Period  Weeks    Status  On-going      PT LONG TERM GOAL #6   Title  Perform ADL's with pain not > 2-3/10.    Time  8    Period  Weeks    Status  On-going            Plan - 08/17/18 1228    Clinical Impression Statement  Pt arrived today reporting no pain at rest only pain with end range flexion/external rotation. Pt performing all exericses well with form/technique corrected to prevent compensation. Continue skilled PT and progress toward LTG's.     Rehab Potential  Good    Clinical Impairments Affecting Rehab Potential  FOTO initial limitation 58% current 5th visit 37%    PT Treatment/Interventions  ADLs/Self Care Home Management;Moist Heat;Electrical Stimulation;Cryotherapy;Ultrasound;Functional mobility training;Therapeutic activities;Therapeutic exercise;Passive range of motion;Manual techniques;Patient/family education;Vasopneumatic Device    PT Next Visit Plan  Per MD progress to strengthening.    PT Home Exercise Plan  RW4 with red theraband    Consulted and Agree with Plan of Care  Patient       Patient will benefit from skilled therapeutic intervention in order to improve the following deficits and impairments:  Pain, Postural dysfunction, Impaired UE functional use, Decreased strength  Visit Diagnosis: Acute pain of right shoulder  Stiffness of right shoulder, not elsewhere classified  Muscle weakness  (generalized)  Abnormal posture  Ulnar neuropathy at elbow, right  Pain in right elbow  Cervicalgia     Problem List Patient Active Problem List   Diagnosis Date Noted  . Cervical spondylosis with radiculopathy 06/15/2016    Sharmon LeydenJennifer R Martin, PT 08/17/2018, 12:36 PM  [facility] [address] [place], [place], [address] Phone: [phone]   Fax:  [phone]  Name: Jon Wong MRN: [id number] Date of Birth: 03/19/1961

## 2018-08-22 ENCOUNTER — Ambulatory Visit: Payer: BLUE CROSS/BLUE SHIELD | Admitting: Physical Therapy

## 2018-08-24 ENCOUNTER — Encounter: Payer: BLUE CROSS/BLUE SHIELD | Admitting: Physical Therapy

## 2018-08-28 ENCOUNTER — Ambulatory Visit: Payer: BLUE CROSS/BLUE SHIELD | Attending: Orthopaedic Surgery | Admitting: Physical Therapy

## 2018-08-28 ENCOUNTER — Encounter: Payer: Self-pay | Admitting: Physical Therapy

## 2018-08-28 DIAGNOSIS — R293 Abnormal posture: Secondary | ICD-10-CM | POA: Insufficient documentation

## 2018-08-28 DIAGNOSIS — M25511 Pain in right shoulder: Secondary | ICD-10-CM | POA: Diagnosis not present

## 2018-08-28 DIAGNOSIS — M25611 Stiffness of right shoulder, not elsewhere classified: Secondary | ICD-10-CM | POA: Insufficient documentation

## 2018-08-28 DIAGNOSIS — M6281 Muscle weakness (generalized): Secondary | ICD-10-CM

## 2018-08-28 NOTE — Therapy (Signed)
[facility] [address] [place], [place], [address] Phone: [phone]   Fax:  [phone]  Physical Therapy Treatment  Patient Details  Name: Jon Wong MRN: [id number] Date of Birth: 10/29/1960 Referring Provider (PT): Ramond Marrowax Varkey MD.   Encounter Date: 08/28/2018  PT End of Session - 08/28/18 1034    Visit Number  22    Number of Visits  24    Date for PT Re-Evaluation  08/15/18    Authorization Type  FOTO AT LEAST EVERY 5TH VISIT, 10TH VISIT PROGRESS NOTE AND KX MODIFIER AFTER THE 15 VISIT.       PT Start Time  1032    PT Stop Time  1131    PT Time Calculation (min)  59 min    Activity Tolerance  Patient tolerated treatment well    Behavior During Therapy  WFL for tasks assessed/performed       Past Medical History:  Diagnosis Date  . Anxiety   . Articular cartilage disorder of shoulder, right 03/2018  . Dupuytren's contracture of right hand   . GERD (gastroesophageal reflux disease)   . History of hiatal hernia   . History of kidney stones   . History of skin cancer    ears  . Hypertension    states under control with med., has been on med. x 2 yr.  . Impingement syndrome of right shoulder 03/2018  . Internal hemorrhoid   . Limited joint range of motion (ROM)    cervical spine - vertical motion  . Neuropathy    bilateral feet  . Non-insulin dependent type 2 diabetes mellitus (HCC)   . Osteoarthritis of right shoulder 03/2018  . Rotator cuff strain 03/2018   right  . Ulnar nerve compression, right   . Wears partial dentures    upper    Past Surgical History:  Procedure Laterality Date  . CARPAL TUNNEL RELEASE Right 2010  . CERVICAL DISC SURGERY  1999  . CERVICAL FUSION  11/06/2002   C4-5  . COLONOSCOPY    . NECK HARDWARE REMOVAL  11/06/2002   C5-6 plate  . NECK HARDWARE REMOVAL  01/29/2008   C4-5  . POSTERIOR CERVICAL FUSION/FORAMINOTOMY N/A 06/15/2016   Procedure: CERVICAL THREE- FOUR CERVICAL FOUR-  FIVE CERVICAL FIVE-SIX CERVICAL SIX- SEVEN POSTERIOR CERVICAL FUSION WITH LATERAL MASS FIXATION;  Surgeon: Hilda LiasErnesto Botero, MD;  Location: [facility];  Service: Neurosurgery;  Laterality: N/A;  C3-4 C4-5 C5-6 C6-7 POSTERIOR CERVICAL FUSION WITH LATERAL MASS FIXATION  . SHOULDER ARTHROSCOPY Left 1998  . SHOULDER ARTHROSCOPY WITH SUBACROMIAL DECOMPRESSION, ROTATOR CUFF REPAIR AND BICEP TENDON REPAIR Right 04/26/2018   Procedure: RIGHT SHOULDER ARTHROSCOPY WITH DEBRIDEMENT, ACROMIOPLASTY, DISTAL CLAVICLE EXCISION, ROTATOR CUFF REPAIR AND BICEP TENODESIS;  Surgeon: Bjorn PippinVarkey, Dax T, MD;  Location: [facility];  Service: Orthopedics;  Laterality: Right;  . WRIST SURGERY Right 2010   hamate fx.    There were no vitals filed for this visit.  Subjective Assessment - 08/28/18 1034    Subjective  No complaints upon arrival.    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    Patient Stated Goals  Use right UE without pain.    Currently in Pain?  No/denies         [facility] - 08/28/18 0001      Assessment   Medical Diagnosis  Right shoulder DCE, SAD, RCR and biceps tenodesis.    Referring Provider (PT)  Dax  Everardo PacificVarkey MD.    Onset Date/Surgical Date  04/27/18    Hand Dominance  Right    Next MD Visit  09/04/2018      Precautions   Precautions  None                   [facility] Adult PT Treatment/Exercise - 08/28/18 0001      Shoulder Exercises: Supine   Diagonals  Strengthening;Right;20 reps;Theraband    Theraband Level (Shoulder Diagonals)  Level 2 (Red)      Shoulder Exercises: Prone   Retraction  Strengthening;Right;Weights    Retraction Weight (lbs)  2    Retraction Limitations  3x10 reps    Extension  Strengthening;Right;Weights    Extension Weight (lbs)  2    Extension Limitations  3x10 reps      Shoulder Exercises: Sidelying   External Rotation  Strengthening;Right;Weights;20 reps    External Rotation Weight (lbs)  2       Shoulder Exercises: Standing   Protraction  Strengthening;Right;Theraband    Theraband Level (Shoulder Protraction)  Level 2 (Red)    Protraction Limitations  3x10 reps    External Rotation  Strengthening;Right;Theraband    Theraband Level (Shoulder External Rotation)  Level 2 (Red)    External Rotation Limitations  3x10 reps    Internal Rotation  Strengthening;Right;Theraband    Theraband Level (Shoulder Internal Rotation)  Level 2 (Red)    Internal Rotation Limitations  3x10 reps    Flexion  Strengthening;Right;20 reps;Weights    Shoulder Flexion Weight (lbs)  2    ABduction  Strengthening;Right;20 reps;Weights    Shoulder ABduction Weight (lbs)  2    Extension  Strengthening;Right;Theraband    Theraband Level (Shoulder Extension)  Level 2 (Red)    Extension Limitations  3x10 reps    Row  Strengthening;Right;Theraband    Theraband Level (Shoulder Row)  Level 2 (Red)    Row Limitations  3x10 reps    Other Standing Exercises  2# ball rolls at 90 deg flex CW 2x10 reps, CCW 2x10 resp      Shoulder Exercises: Pulleys   Flexion  3 minutes      Shoulder Exercises: ROM/Strengthening   UBE (Upper Arm Bike)  90 RPM x6 min   forward/backward     Shoulder Exercises: Stretch   Internal Rotation Stretch  Other (comment)    Internal Rotation Stretch Limitations  dynamic stretching    Other Shoulder Stretches  Towel stretch 3x30 sec      Modalities   Modalities  Electrical Stimulation;Vasopneumatic      Electrical Stimulation   Electrical Stimulation Location  R shoulder    Electrical Stimulation Action  IFC    Electrical Stimulation Parameters  80-150 hz x15 min    Electrical Stimulation Goals  Pain      Vasopneumatic   Number Minutes Vasopneumatic   15 minutes    Vasopnuematic Location   Shoulder    Vasopneumatic Pressure  Low    Vasopneumatic Temperature   34               PT Short Term Goals - 01/25/18 1257      PT SHORT TERM GOAL #1   Title  STG's=LTG's.         PT Long Term Goals - 08/28/18 1043      PT LONG TERM GOAL #1   Title  Patient will be independent with HEP.    Time  8    Period  Weeks  Status  Achieved      PT LONG TERM GOAL #2   Title  Active right shoulder flexion to 145 degrees so the patient can easily reach overhead.    Time  8    Period  Weeks    Status  On-going      PT LONG TERM GOAL #3   Title  Active ER to 70 degrees+ to allow for easily donning/doffing of apparel.    Period  Weeks    Status  On-going      PT LONG TERM GOAL #4   Title  Increase ROM so patient is able to reach behind back to L3.    Time  8    Period  Weeks    Status  On-going      PT LONG TERM GOAL #5   Title  Increase shoulder strength to a solid 4+/5 to increase stability for performance of functional activities.    Time  8    Period  Weeks    Status  On-going      PT LONG TERM GOAL #6   Title  Perform ADL's with pain not > 2-3/10.    Time  8    Period  Weeks    Status  Achieved              Patient will benefit from skilled therapeutic intervention in order to improve the following deficits and impairments:     Visit Diagnosis: Acute pain of right shoulder  Stiffness of right shoulder, not elsewhere classified  Muscle weakness (generalized)  Abnormal posture     Problem List Patient Active Problem List   Diagnosis Date Noted  . Cervical spondylosis with radiculopathy 06/15/2016    Jon Wong, PTA 08/28/2018, 11:36 AM  [facility] [address] [place], [place], [address] Phone: [phone]   Fax:  [phone]  Name: Jon Wong MRN: [id number] Date of Birth: 02-Dec-1960

## 2018-08-31 ENCOUNTER — Ambulatory Visit: Payer: BLUE CROSS/BLUE SHIELD | Admitting: Physical Therapy

## 2018-08-31 ENCOUNTER — Encounter: Payer: Self-pay | Admitting: Physical Therapy

## 2018-08-31 DIAGNOSIS — M25511 Pain in right shoulder: Secondary | ICD-10-CM

## 2018-08-31 DIAGNOSIS — R293 Abnormal posture: Secondary | ICD-10-CM

## 2018-08-31 DIAGNOSIS — M6281 Muscle weakness (generalized): Secondary | ICD-10-CM

## 2018-08-31 DIAGNOSIS — M25611 Stiffness of right shoulder, not elsewhere classified: Secondary | ICD-10-CM

## 2018-08-31 NOTE — Therapy (Addendum)
[facility] [place], [place], [address] Phone: [phone]   Fax:  [phone]  Physical Therapy Treatment  Patient Details  Name: Jon Wong MRN: [id number] Date of Birth: [birth year] Referring Provider (PT): Ophelia Charter MD.   Encounter Date: 08/31/2018  PT End of Session - 08/31/18 1033    Visit Number  23    Number of Visits  24    Date for PT Re-Evaluation  08/15/18    Authorization Type  FOTO AT LEAST EVERY 5TH VISIT, 10TH VISIT PROGRESS NOTE AND KX MODIFIER AFTER THE 15 VISIT.       PT Start Time  1030    PT Stop Time  1135    PT Time Calculation (min)  65 min    Activity Tolerance  Patient tolerated treatment well    Behavior During Therapy  WFL for tasks assessed/performed       Past Medical History:  Diagnosis Date  . Anxiety   . Articular cartilage disorder of shoulder, right 03/2018  . Dupuytren's contracture of right hand   . GERD (gastroesophageal reflux disease)   . History of hiatal hernia   . History of kidney stones   . History of skin cancer    ears  . Hypertension    states under control with med., has been on med. x 2 yr.  . Impingement syndrome of right shoulder 03/2018  . Internal hemorrhoid   . Limited joint range of motion (ROM)    cervical spine - vertical motion  . Neuropathy    bilateral feet  . Non-insulin dependent type 2 diabetes mellitus ([name])   . Osteoarthritis of right shoulder 03/2018  . Rotator cuff strain 03/2018   right  . Ulnar nerve compression, right   . Wears partial dentures    upper    Past Surgical History:  Procedure Laterality Date  . CARPAL TUNNEL RELEASE Right 2010  . [place]  . CERVICAL FUSION  11/06/2002   C4-5  . COLONOSCOPY    . NECK HARDWARE REMOVAL  11/06/2002   C5-6 plate  . NECK HARDWARE REMOVAL  01/29/2008   C4-5  . POSTERIOR CERVICAL FUSION/FORAMINOTOMY N/A 06/15/2016   Procedure: CERVICAL THREE- FOUR CERVICAL FOUR-  FIVE CERVICAL FIVE-SIX CERVICAL SIX- SEVEN POSTERIOR CERVICAL FUSION WITH LATERAL MASS FIXATION;  Surgeon: Leeroy Cha, MD;  Location: [facility];  Service: Neurosurgery;  Laterality: N/A;  C3-4 C4-5 C5-6 C6-7 POSTERIOR CERVICAL FUSION WITH LATERAL MASS FIXATION  . SHOULDER ARTHROSCOPY Left 1998  . SHOULDER ARTHROSCOPY WITH SUBACROMIAL DECOMPRESSION, ROTATOR CUFF REPAIR AND BICEP TENDON REPAIR Right 04/26/2018   Procedure: RIGHT SHOULDER ARTHROSCOPY WITH DEBRIDEMENT, ACROMIOPLASTY, DISTAL CLAVICLE EXCISION, ROTATOR CUFF REPAIR AND BICEP TENODESIS;  Surgeon: Hiram Gash, MD;  Location: [facility];  Service: Orthopedics;  Laterality: Right;  . WRIST SURGERY Right 2010   hamate fx.    There were no vitals filed for this visit.  Subjective Assessment - 08/31/18 1032    Subjective  Reports more pain in the R bicep.     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    Patient Stated Goals  Use right UE without pain.    Currently in Pain?  Yes    Pain Score  4     Pain Location  Shoulder    Pain Orientation  Right    Pain Descriptors / Indicators  Sharp    Pain Type  Surgical pain    Pain Onset  More than a month ago         [facility] PT Assessment - 08/31/18 0001      Assessment   Medical Diagnosis  Right shoulder DCE, SAD, RCR and biceps tenodesis.    Referring Provider (PT)  Ophelia Charter MD.    Onset Date/Surgical Date  04/27/18    Hand Dominance  Right    Next MD Visit  09/04/2018      Precautions   Precautions  None      ROM / Strength   AROM / PROM / Strength  AROM;Strength      AROM   Overall AROM   Deficits    AROM Assessment Site  Shoulder    Right/Left Shoulder  Right    Right Shoulder Flexion  130 Degrees    Right Shoulder Internal Rotation  53 Degrees    Right Shoulder External Rotation  40 Degrees      Strength   Overall Strength  Within functional limits for tasks performed    Strength Assessment Site  Shoulder     Right/Left Shoulder  Right    Right Shoulder Flexion  5/5    Right Shoulder ABduction  5/5    Right Shoulder Internal Rotation  5/5    Right Shoulder External Rotation  4+/5                   [facility] Adult PT Treatment/Exercise - 08/31/18 0001      Shoulder Exercises: Sidelying   External Rotation  Strengthening;Right;Weights;20 reps    External Rotation Weight (lbs)  2      Shoulder Exercises: Standing   Protraction  Strengthening;Right;Theraband    Theraband Level (Shoulder Protraction)  Level 2 (Red)    Protraction Limitations  3x10 reps    External Rotation  Strengthening;Right;Theraband    Theraband Level (Shoulder External Rotation)  Level 2 (Red)    External Rotation Limitations  3x10 reps    Internal Rotation  Strengthening;Right;Theraband    Theraband Level (Shoulder Internal Rotation)  Level 2 (Red)    Internal Rotation Limitations  3x10 reps    Flexion  Strengthening;Right;20 reps;Weights    Shoulder Flexion Weight (lbs)  2    ABduction  Strengthening;Right;20 reps;Weights    Shoulder ABduction Weight (lbs)  2    Extension  Strengthening;Right;Theraband    Theraband Level (Shoulder Extension)  Level 2 (Red)    Extension Limitations  3x10 reps    Row  Strengthening;Right;Theraband    Theraband Level (Shoulder Row)  Level 2 (Red)    Row Limitations  3x10 reps    Other Standing Exercises  Wall slides x20 reps      Shoulder Exercises: Pulleys   Flexion  3 minutes      Shoulder Exercises: ROM/Strengthening   UBE (Upper Arm Bike)  90 RPM x8 min      Shoulder Exercises: Stretch   Corner Stretch  3 reps;30 seconds      Modalities   Modalities  Psychologist, educational Location  R shoulder    Electrical Stimulation Action  IFC    Electrical Stimulation Parameters  80-150 hz x15 min    Electrical Stimulation Goals  Pain      Vasopneumatic   Number Minutes Vasopneumatic   15 minutes     Vasopnuematic Location   Shoulder    Vasopneumatic Pressure  Low    Vasopneumatic Temperature  34               PT Short Term Goals - 01/25/18 1257      PT SHORT TERM GOAL #1   Title  STG's=LTG's.        PT Long Term Goals - 08/31/18 1131      PT LONG TERM GOAL #1   Title  Patient will be independent with HEP.    Time  8    Period  Weeks    Status  Achieved      PT LONG TERM GOAL #2   Title  Active right shoulder flexion to 145 degrees so the patient can easily reach overhead.    Time  8    Period  Weeks    Status  Not Met      PT LONG TERM GOAL #3   Title  Active ER to 70 degrees+ to allow for easily donning/doffing of apparel.    Period  Weeks    Status  Not Met      PT LONG TERM GOAL #4   Title  Increase ROM so patient is able to reach behind back to L3.    Time  8    Period  Weeks    Status  On-going   55 deg IR 08/31/2018     PT LONG TERM GOAL #5   Title  Increase shoulder strength to a solid 4+/5 to increase stability for performance of functional activities.    Time  8    Period  Weeks    Status  Achieved      PT LONG TERM GOAL #6   Title  Perform ADL's with pain not > 2-3/10.    Time  8    Period  Weeks    Status  Achieved            Plan - 08/31/18 1202    Clinical Impression Statement  Patient presented in clinic with reports of sharp pains in R bicep. Patient able to complete therex session with intermittant facial grimacing at end range flexion during pulleys. Patient's pectoralis noted as tight during supine D2 and standing exercises. AROM of R shoulder limited and not able to be achieved during PT. Strength of R shoulder progressed and able to be be achieved during PT. Normal modalities response noted following removal of the modalities.    Rehab Potential  Good    Clinical Impairments Affecting Rehab Potential  FOTO initial limitation 58% current 5th visit 37%    PT Treatment/Interventions  ADLs/Self Care Home Management;Moist  Heat;Electrical Stimulation;Cryotherapy;Ultrasound;Functional mobility training;Therapeutic activities;Therapeutic exercise;Passive range of motion;Manual techniques;Patient/family education;Vasopneumatic Device    PT Next Visit Plan  Possible D/C summary following next MD visit.    PT Home Exercise Plan  RW4 with red theraband    Consulted and Agree with Plan of Care  Patient       Patient will benefit from skilled therapeutic intervention in order to improve the following deficits and impairments:  Pain, Postural dysfunction, Impaired UE functional use, Decreased strength  Visit Diagnosis: Acute pain of right shoulder  Stiffness of right shoulder, not elsewhere classified  Muscle weakness (generalized)  Abnormal posture     Problem List Patient Active Problem List   Diagnosis Date Noted  . Cervical spondylosis with radiculopathy 06/15/2016   Standley Brooking, PTA 08/31/18 12:15 PM   [facility] [address] [place], [place], [address] Phone: [phone]   Fax:  [phone]  Name: Jon Wong MRN: [id number] Date of Birth: 1960/11/07  PHYSICAL THERAPY DISCHARGE SUMMARY  Visits from Start of Care: 23.  Current functional level related to goals / functional outcomes: See above.   Remaining deficits: See goal section.   Education / Equipment: HEP. Plan: Patient agrees to discharge.  Patient goals were partially met. Patient is being discharged due to being pleased with the current functional level.  ?????           [name] MPT

## 2019-08-09 IMAGING — XA DG MYELOGRAPHY LUMBAR INJ CERVICAL
9 series · 9 of 9 positions shown · non-contrast
Comparison: Cervical myelogram 04/26/2016

CLINICAL DATA: Neck pain. Numbness in the ring and small fingers of
the right hand. Prior cervical fusion.
TECHNIQUE: Contiguous axial images were obtained through the Cervical spine
after the intrathecal infusion of infusion. Coronal and sagittal
reconstructions were obtained of the axial image sets.

[Series 1: vasc standard · 1 of 1 slices shown (1 of 9)]
[im 1/1]
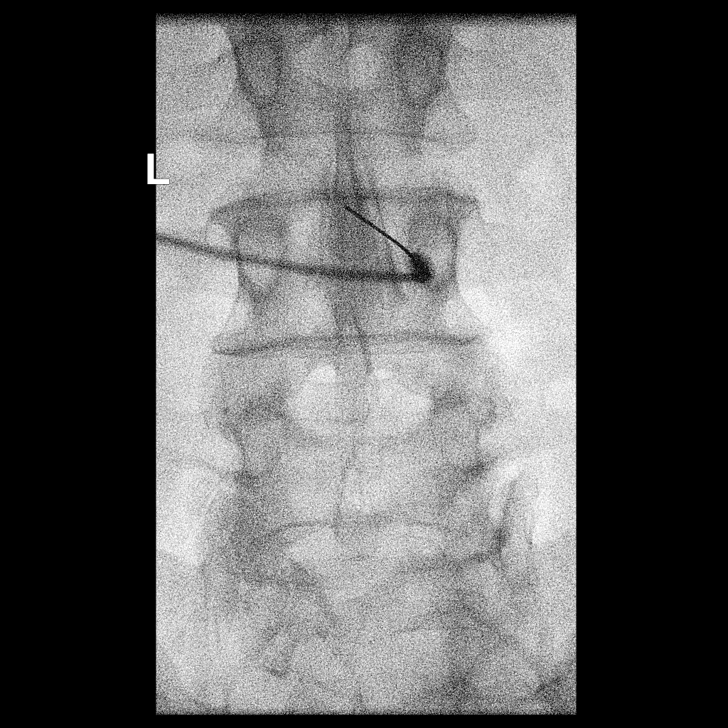

[Series 2: vasc standard · 1 of 1 slices shown (2 of 9)]
[im 1/1]
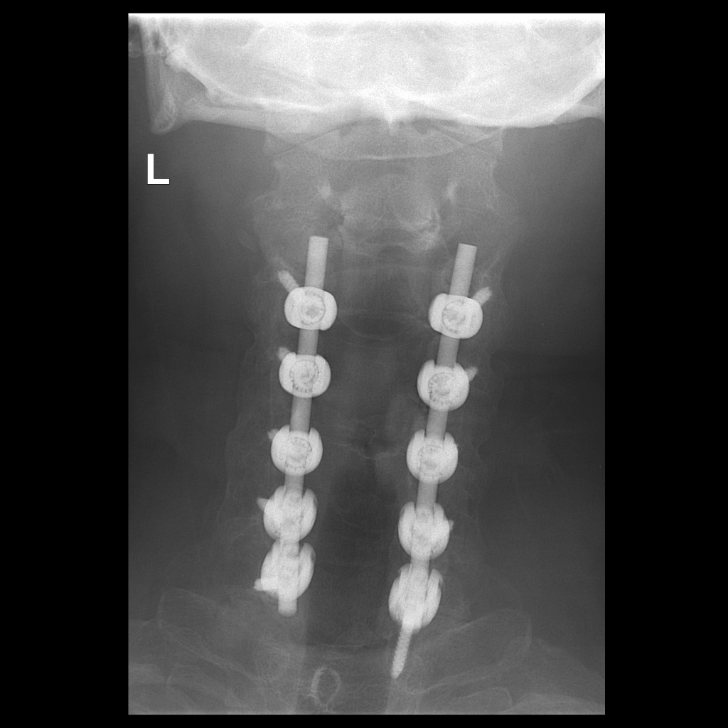

[Series 3: vasc standard · 1 of 1 slices shown (3 of 9)]
[im 1/1]
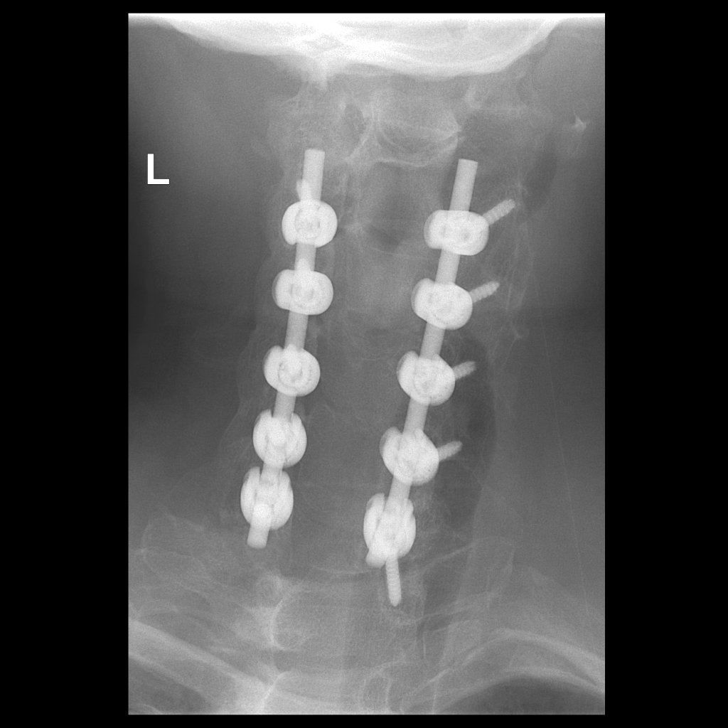

[Series 4: vasc standard · 1 of 1 slices shown (4 of 9)]
[im 1/1]
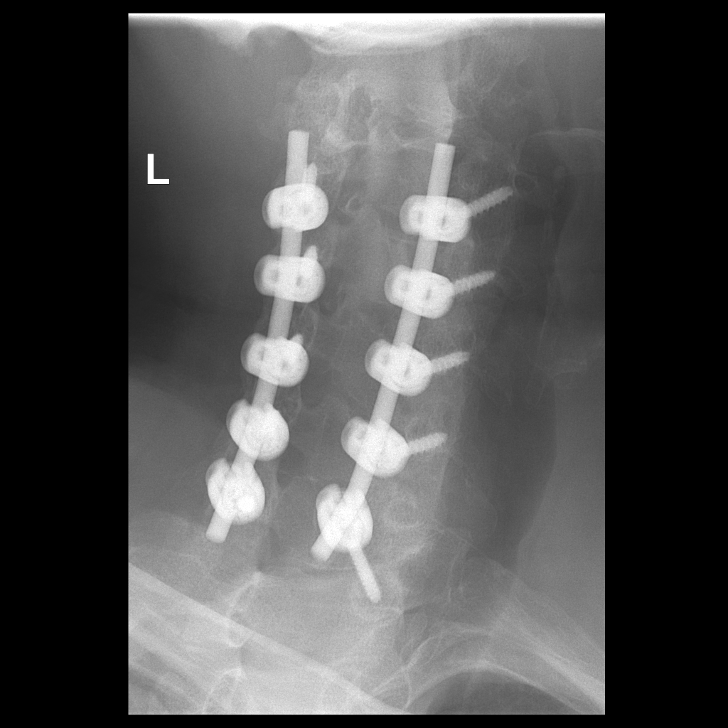

[Series 5: vasc standard · 1 of 1 slices shown (5 of 9)]
[im 1/1]
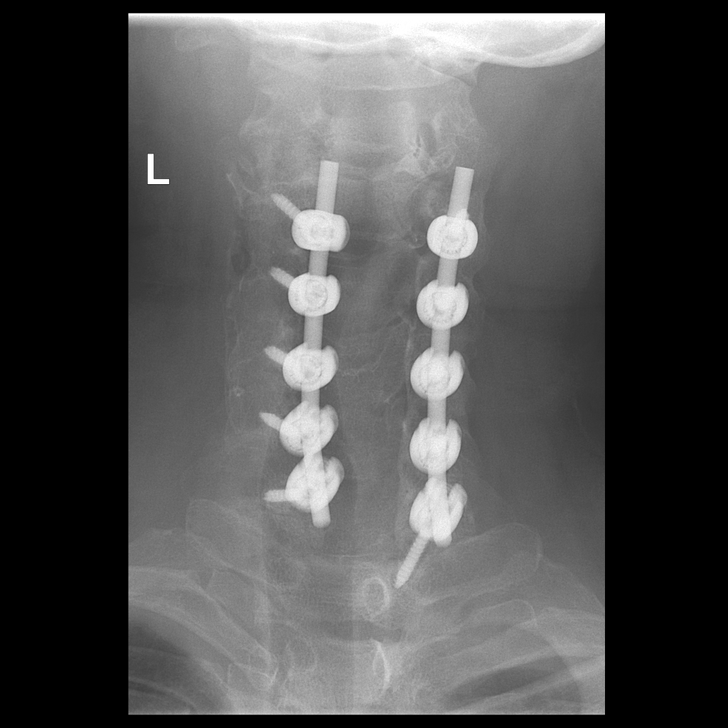

[Series 6: vasc standard · 1 of 1 slices shown (6 of 9)]
[im 1/1]
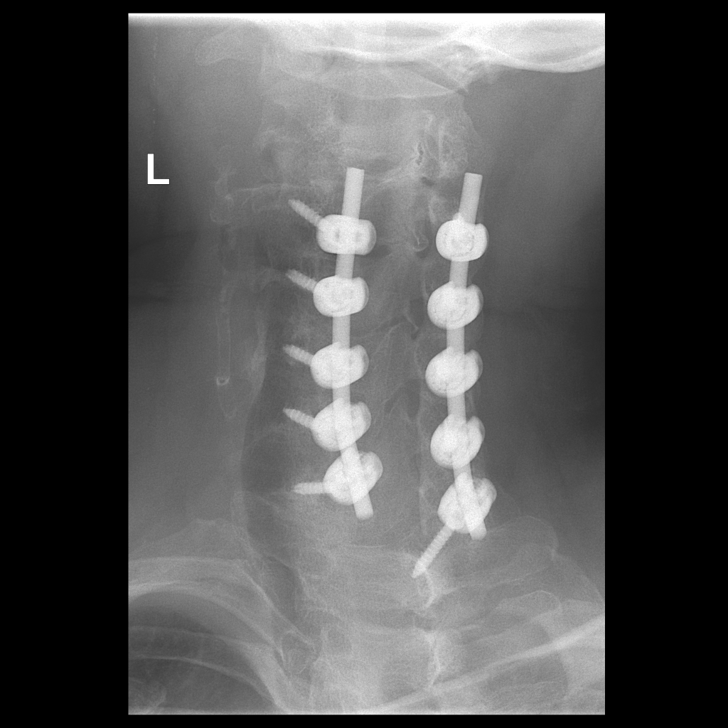

[Series 7: vasc standard · 1 of 1 slices shown (7 of 9)]
[im 1/1]
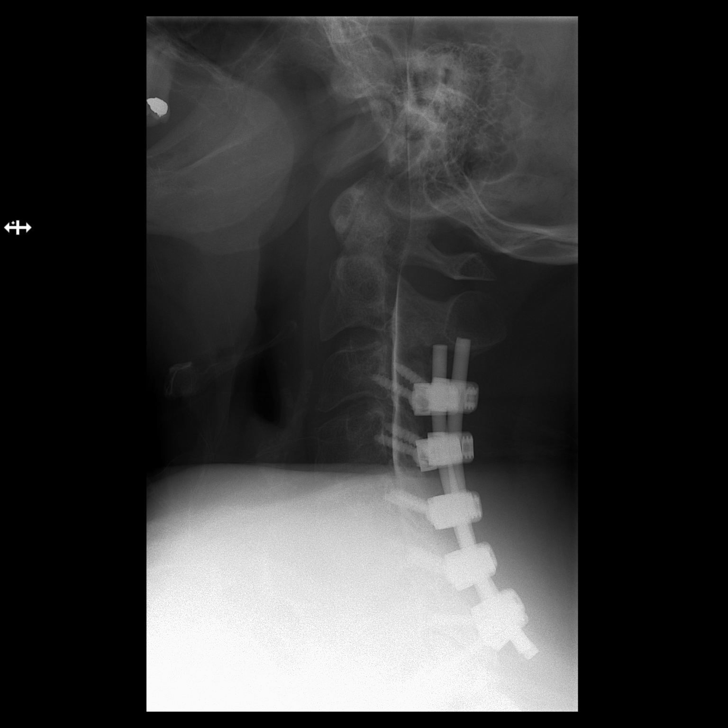

[Series 8: vasc standard · 1 of 1 slices shown (8 of 9)]
[im 1/1]
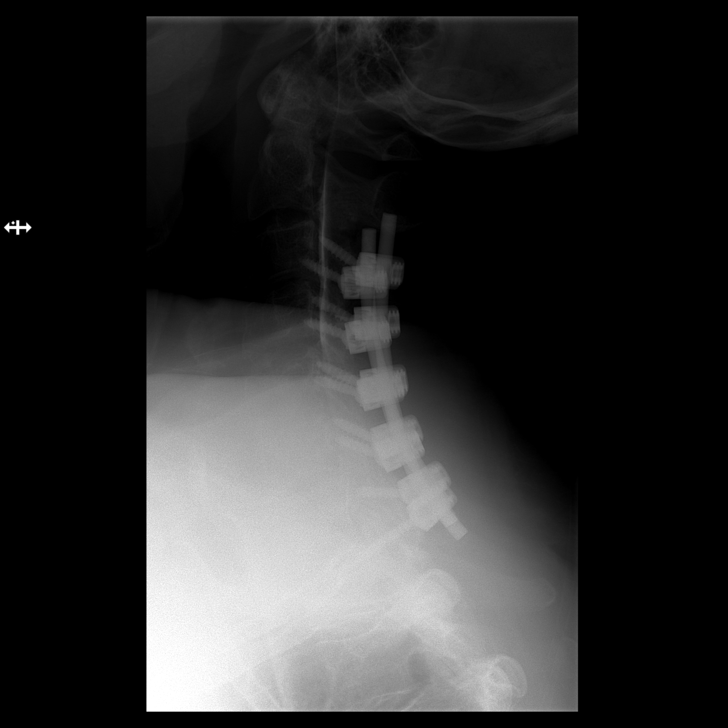

[Series 9: vasc standard · 1 of 1 slices shown (9 of 9)]
[im 1/1]
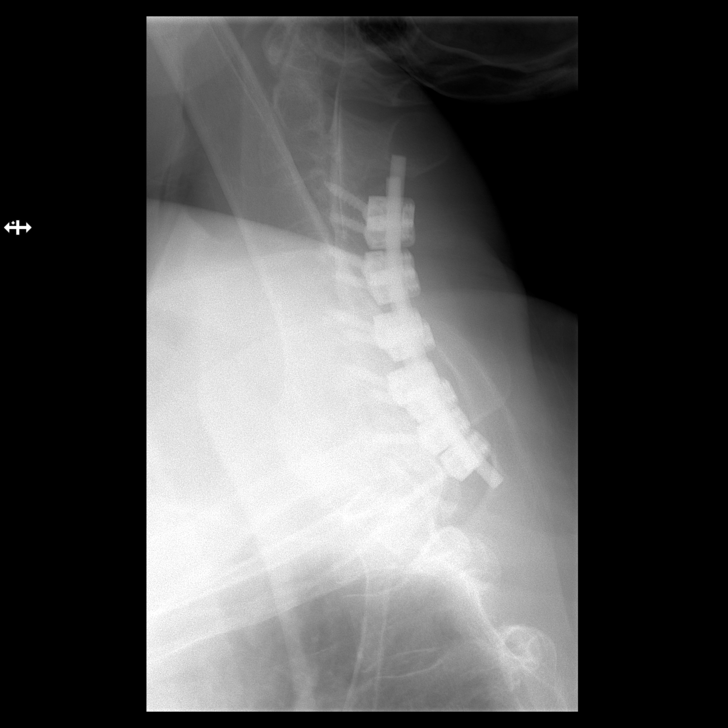

[9 of 9 positions shown; findings below may reference images not displayed]

FLUOROSCOPY TIME:  Radiation Exposure Index (as provided by the
fluoroscopic device): 118.29 microGray*m^2

Fluoroscopy Time (in minutes and seconds):  40 seconds

PROCEDURE:
LUMBAR PUNCTURE FOR CERVICAL MYELOGRAM

After thorough discussion of risks and benefits of the procedure
including bleeding, infection, injury to nerves, blood vessels,
adjacent structures as well as headache and CSF leak, written and
oral informed consent was obtained. Consent was obtained by Dr.
Kiri Jim. We discussed the high likelihood of obtaining a
diagnostic study.

Patient was positioned prone on the fluoroscopy table. Local
anesthesia was provided with 1% lidocaine without epinephrine after
prepped and draped in the usual sterile fashion. Puncture was
performed at L3-4 using a 3 1/2 inch 22-gauge spinal needle via a
right interlaminar approach. Using a single pass through the dura,
the needle was placed within the thecal sac, with return of clear
CSF. 10 mL of Isovue 3-YQQ was injected into the thecal sac, with
normal opacification of the nerve roots and cauda equina consistent
with free flow within the subarachnoid space. The patient was then
moved to the trendelenburg position and contrast flowed into the
Cervical spine region.

I personally performed the lumbar puncture and administered the
intrathecal contrast. I also personally supervised acquisition of
the myelogram images.
FINDINGS: CERVICAL MYELOGRAM FINDINGS:

There is trace retrolisthesis of C3 on C4. Posterior decompression
and fusion have been performed from C3-C7. No gross spinal stenosis
is identified allowing for mildly dilute appearance of contrast on
conventional myelographic images. Nerve root sleeves are not well
evaluated on AP and oblique images.

CT CERVICAL MYELOGRAM FINDINGS:

There is trace retrolisthesis of C3 on C4. Prior ACDF procedures are
again noted at C4-5 and C5-6. There has been interval development of
solid osseous fusion centrally across the C5-6 disc space. Solid
interbody osseous fusion is again not apparent at C4-5.

C3-C7 posterior decompression and fusion have been performed in the
interim. Bilateral articular pillar screws are present. The lowest
screw on the right crosses the C7-T1 facet joint and terminates in
the right T1 pedicle with mild-to-moderate lucency surrounding the
screw. There is solid posterior osseous fusion bilaterally from
C3-C7. No acute fracture or suspicious osseous lesion is identified.
Metallic streak artifact obscures the spinal cord at multiple
levels. The lung apices are clear.

C2-3: Progressive, severe left facet arthrosis results in new,
moderate to severe left neural foraminal stenosis. No spinal
stenosis.

C3-4: Interval posterior decompression and fusion.  No stenosis.

C4-5: Interval posterior decompression and fusion.  No stenosis.

C5-6: Interval posterior decompression and fusion. Mild right
uncovertebral spurring without compressive neural foraminal
stenosis. Widely patent spinal canal.

C6-7: Interval posterior decompression and fusion. Osteophytic
ridging without stenosis.

C7-T1: Interval posterior decompression. Progressive moderate
bilateral facet arthrosis without stenosis.
IMPRESSION: 1. Interval C3-C7 posterior decompression and fusion. Solid osseous
fusion without residual stenosis. Lucency about the lowest
right-sided screw which traverses the C7-T1 facet joint.
2. Progressive severe left facet arthrosis at C2-3 with moderate to
severe left neural foraminal stenosis.

## 2019-08-09 IMAGING — CT CT CERVICAL SPINE W/ CM
2 series · 10 of 14 positions shown, 12 images · non-contrast
Comparison: Cervical myelogram 04/26/2016

CLINICAL DATA: Neck pain. Numbness in the ring and small fingers of
the right hand. Prior cervical fusion.
TECHNIQUE: Contiguous axial images were obtained through the Cervical spine
after the intrathecal infusion of infusion. Coronal and sagittal
reconstructions were obtained of the axial image sets.

[Series 3: cspine soft · axial · 0.31mm/px · z∈[-219,-97]mm · 5 of 93 slices shown]
[im 16/93  soft-tissue]
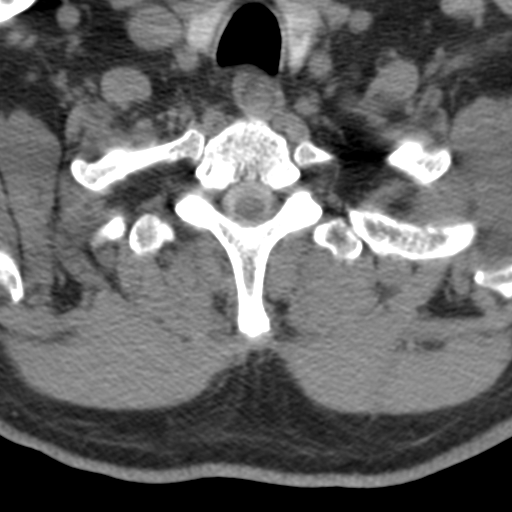
[im 31/93  soft-tissue]
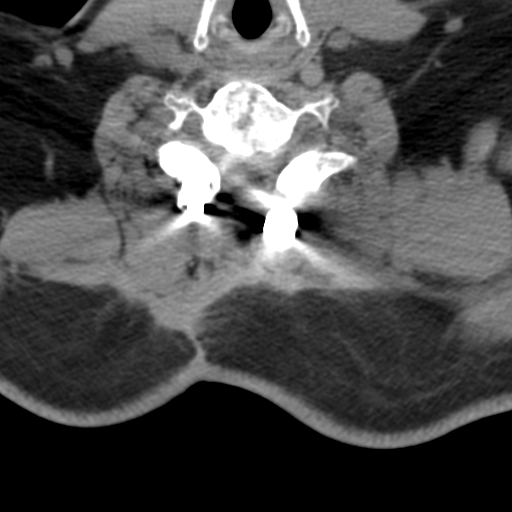
[im 47/93  soft-tissue]
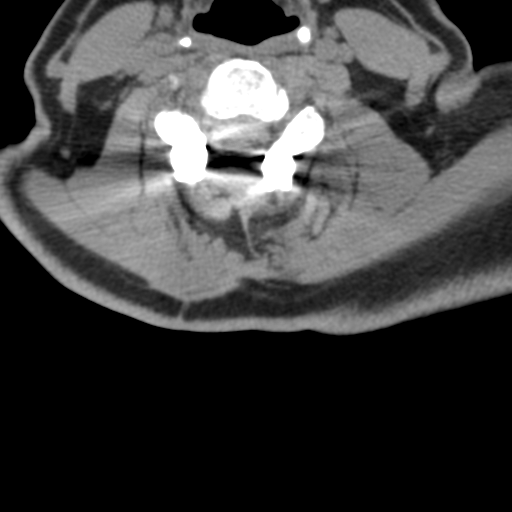
[im 62/93  soft-tissue]
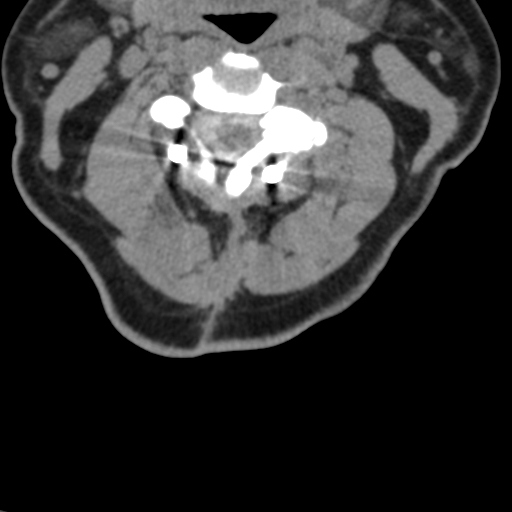
[im 77/93  soft-tissue]
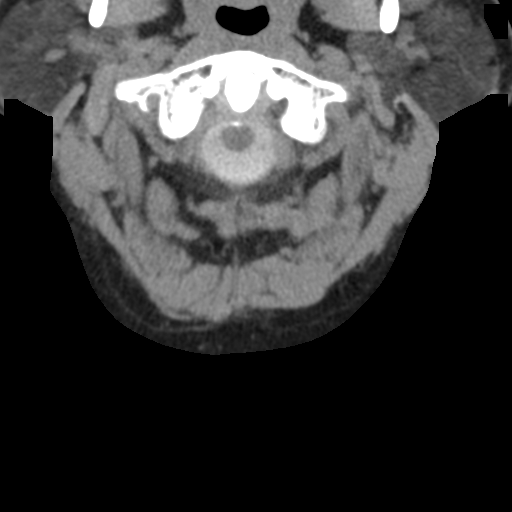

[Series 9: angled axial · axial · 0.29mm/px · z∈[-227,-106]mm · 5 of 93 slices shown, 7 images]
[im 16/93  soft-tissue]
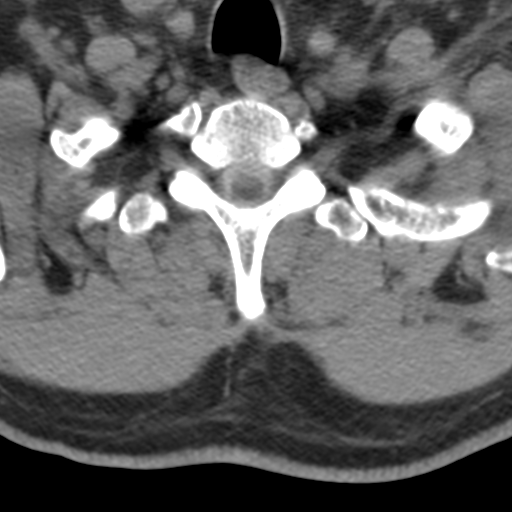
[im 16/93  bone]
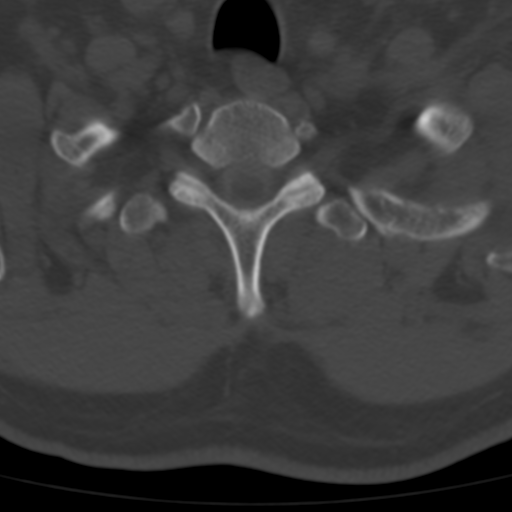
[im 31/93  bone]
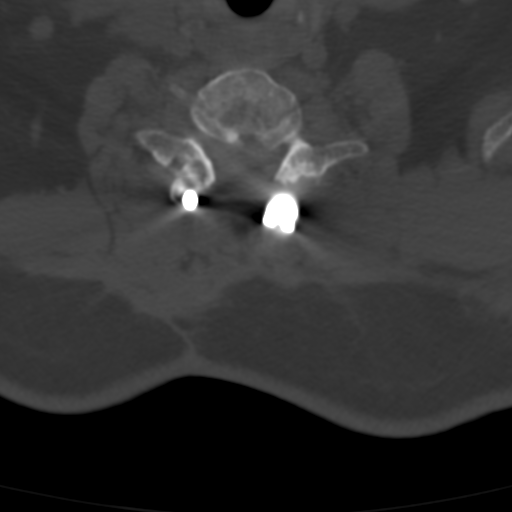
[im 47/93  bone]
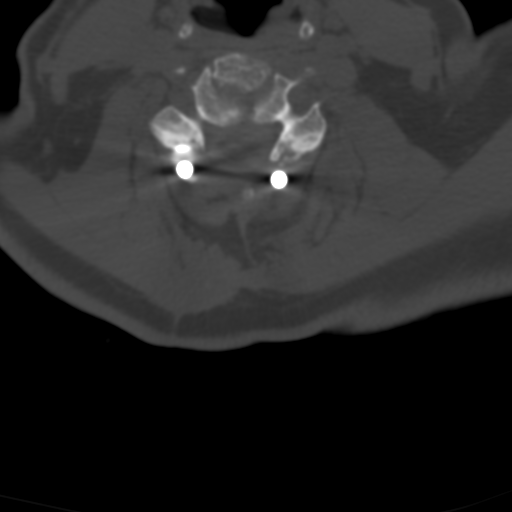
[im 62/93  bone]
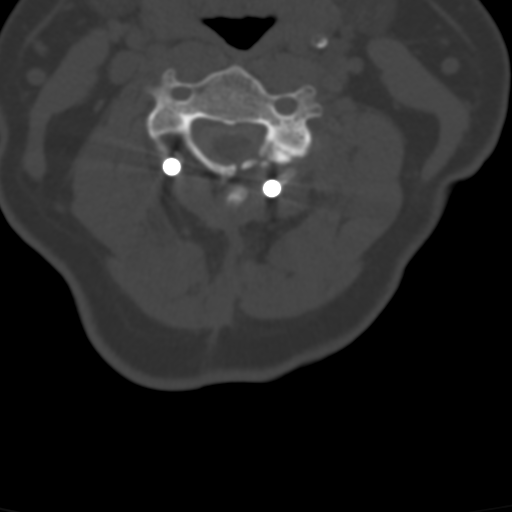
[im 77/93  soft-tissue]
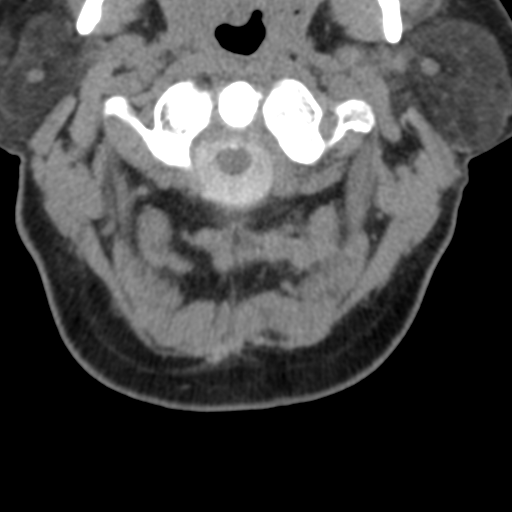
[im 77/93  bone]
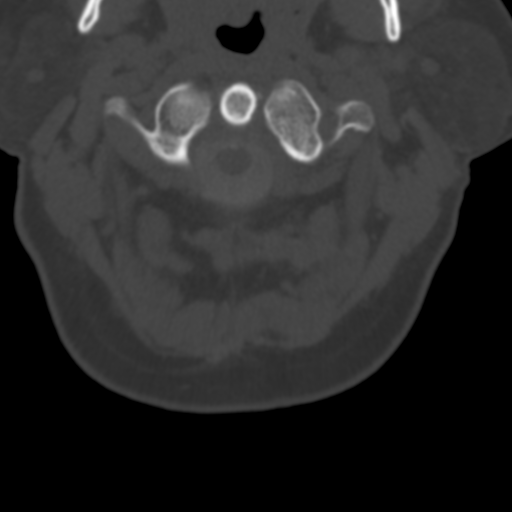

[10 of 14 positions shown; findings below may reference images not displayed]

FLUOROSCOPY TIME:  Radiation Exposure Index (as provided by the
fluoroscopic device): 118.29 microGray*m^2

Fluoroscopy Time (in minutes and seconds):  40 seconds

PROCEDURE:
LUMBAR PUNCTURE FOR CERVICAL MYELOGRAM

After thorough discussion of risks and benefits of the procedure
including bleeding, infection, injury to nerves, blood vessels,
adjacent structures as well as headache and CSF leak, written and
oral informed consent was obtained. Consent was obtained by Dr.
Kiri Jim. We discussed the high likelihood of obtaining a
diagnostic study.

Patient was positioned prone on the fluoroscopy table. Local
anesthesia was provided with 1% lidocaine without epinephrine after
prepped and draped in the usual sterile fashion. Puncture was
performed at L3-4 using a 3 1/2 inch 22-gauge spinal needle via a
right interlaminar approach. Using a single pass through the dura,
the needle was placed within the thecal sac, with return of clear
CSF. 10 mL of Isovue 3-YQQ was injected into the thecal sac, with
normal opacification of the nerve roots and cauda equina consistent
with free flow within the subarachnoid space. The patient was then
moved to the trendelenburg position and contrast flowed into the
Cervical spine region.

I personally performed the lumbar puncture and administered the
intrathecal contrast. I also personally supervised acquisition of
the myelogram images.
FINDINGS: CERVICAL MYELOGRAM FINDINGS:

There is trace retrolisthesis of C3 on C4. Posterior decompression
and fusion have been performed from C3-C7. No gross spinal stenosis
is identified allowing for mildly dilute appearance of contrast on
conventional myelographic images. Nerve root sleeves are not well
evaluated on AP and oblique images.

CT CERVICAL MYELOGRAM FINDINGS:

There is trace retrolisthesis of C3 on C4. Prior ACDF procedures are
again noted at C4-5 and C5-6. There has been interval development of
solid osseous fusion centrally across the C5-6 disc space. Solid
interbody osseous fusion is again not apparent at C4-5.

C3-C7 posterior decompression and fusion have been performed in the
interim. Bilateral articular pillar screws are present. The lowest
screw on the right crosses the C7-T1 facet joint and terminates in
the right T1 pedicle with mild-to-moderate lucency surrounding the
screw. There is solid posterior osseous fusion bilaterally from
C3-C7. No acute fracture or suspicious osseous lesion is identified.
Metallic streak artifact obscures the spinal cord at multiple
levels. The lung apices are clear.

C2-3: Progressive, severe left facet arthrosis results in new,
moderate to severe left neural foraminal stenosis. No spinal
stenosis.

C3-4: Interval posterior decompression and fusion.  No stenosis.

C4-5: Interval posterior decompression and fusion.  No stenosis.

C5-6: Interval posterior decompression and fusion. Mild right
uncovertebral spurring without compressive neural foraminal
stenosis. Widely patent spinal canal.

C6-7: Interval posterior decompression and fusion. Osteophytic
ridging without stenosis.

C7-T1: Interval posterior decompression. Progressive moderate
bilateral facet arthrosis without stenosis.
IMPRESSION: 1. Interval C3-C7 posterior decompression and fusion. Solid osseous
fusion without residual stenosis. Lucency about the lowest
right-sided screw which traverses the C7-T1 facet joint.
2. Progressive severe left facet arthrosis at C2-3 with moderate to
severe left neural foraminal stenosis.
